# Patient Record
Sex: Female | Born: 1980 | Race: Black or African American | Hispanic: No | Marital: Single | Smoking: Never smoker
Health system: Southern US, Community
[De-identification: ages and names within clinical notes are randomized; demographics above are authoritative.]

## PROBLEM LIST (undated history)

## (undated) DIAGNOSIS — I10 Essential (primary) hypertension: Secondary | ICD-10-CM

## (undated) HISTORY — PX: TONSILLECTOMY AND ADENOIDECTOMY: SUR1326

---

## 2008-11-14 HISTORY — PX: UMBILICAL HERNIA REPAIR: SHX196

## 2018-12-18 ENCOUNTER — Other Ambulatory Visit: Payer: Self-pay

## 2018-12-18 DIAGNOSIS — K859 Acute pancreatitis without necrosis or infection, unspecified: Secondary | ICD-10-CM | POA: Diagnosis present

## 2018-12-18 DIAGNOSIS — R1011 Right upper quadrant pain: Secondary | ICD-10-CM | POA: Diagnosis not present

## 2018-12-18 DIAGNOSIS — K8062 Calculus of gallbladder and bile duct with acute cholecystitis without obstruction: Secondary | ICD-10-CM | POA: Diagnosis not present

## 2018-12-18 DIAGNOSIS — Z79899 Other long term (current) drug therapy: Secondary | ICD-10-CM

## 2018-12-18 DIAGNOSIS — I10 Essential (primary) hypertension: Secondary | ICD-10-CM | POA: Diagnosis present

## 2018-12-18 DIAGNOSIS — E876 Hypokalemia: Secondary | ICD-10-CM | POA: Diagnosis present

## 2018-12-18 DIAGNOSIS — Z1159 Encounter for screening for other viral diseases: Secondary | ICD-10-CM

## 2018-12-19 ENCOUNTER — Inpatient Hospital Stay
Admission: EM | Admit: 2018-12-19 | Discharge: 2018-12-22 | DRG: 444 | Disposition: A | Discharge status: Home / Self Care | Payer: Managed Care, Other (non HMO) | Attending: Family Medicine | Admitting: Family Medicine

## 2018-12-19 ENCOUNTER — Inpatient Hospital Stay: Payer: Managed Care, Other (non HMO)

## 2018-12-19 ENCOUNTER — Other Ambulatory Visit: Payer: Self-pay

## 2018-12-19 ENCOUNTER — Inpatient Hospital Stay: Payer: Managed Care, Other (non HMO) | Admitting: Anesthesiology

## 2018-12-19 ENCOUNTER — Emergency Department: Payer: Managed Care, Other (non HMO)

## 2018-12-19 ENCOUNTER — Encounter
Admission: EM | Disposition: A | Discharge status: Home / Self Care | Payer: Self-pay | Source: Home / Self Care | Attending: Specialist

## 2018-12-19 ENCOUNTER — Encounter: Payer: Self-pay | Admitting: *Deleted

## 2018-12-19 DIAGNOSIS — R1011 Right upper quadrant pain: Secondary | ICD-10-CM | POA: Diagnosis present

## 2018-12-19 DIAGNOSIS — K859 Acute pancreatitis without necrosis or infection, unspecified: Secondary | ICD-10-CM | POA: Diagnosis present

## 2018-12-19 DIAGNOSIS — I10 Essential (primary) hypertension: Secondary | ICD-10-CM | POA: Diagnosis present

## 2018-12-19 DIAGNOSIS — K8042 Calculus of bile duct with acute cholecystitis without obstruction: Secondary | ICD-10-CM

## 2018-12-19 DIAGNOSIS — K8050 Calculus of bile duct without cholangitis or cholecystitis without obstruction: Secondary | ICD-10-CM

## 2018-12-19 DIAGNOSIS — K8062 Calculus of gallbladder and bile duct with acute cholecystitis without obstruction: Secondary | ICD-10-CM | POA: Diagnosis present

## 2018-12-19 DIAGNOSIS — K819 Cholecystitis, unspecified: Secondary | ICD-10-CM

## 2018-12-19 DIAGNOSIS — R945 Abnormal results of liver function studies: Secondary | ICD-10-CM

## 2018-12-19 DIAGNOSIS — Z1159 Encounter for screening for other viral diseases: Secondary | ICD-10-CM | POA: Diagnosis not present

## 2018-12-19 DIAGNOSIS — K810 Acute cholecystitis: Secondary | ICD-10-CM

## 2018-12-19 DIAGNOSIS — R17 Unspecified jaundice: Secondary | ICD-10-CM

## 2018-12-19 DIAGNOSIS — Z79899 Other long term (current) drug therapy: Secondary | ICD-10-CM | POA: Diagnosis not present

## 2018-12-19 DIAGNOSIS — E876 Hypokalemia: Secondary | ICD-10-CM | POA: Diagnosis present

## 2018-12-19 DIAGNOSIS — R7989 Other specified abnormal findings of blood chemistry: Secondary | ICD-10-CM

## 2018-12-19 HISTORY — PX: ERCP: SHX5425

## 2018-12-19 HISTORY — DX: Essential (primary) hypertension: I10

## 2018-12-19 LAB — CBC
HCT: 39.7 % (ref 36.0–46.0)
Hemoglobin: 12.6 g/dL (ref 12.0–15.0)
MCH: 28.1 pg (ref 26.0–34.0)
MCHC: 31.7 g/dL (ref 30.0–36.0)
MCV: 88.4 fL (ref 80.0–100.0)
Platelets: 220 10*3/uL (ref 150–400)
RBC: 4.49 MIL/uL (ref 3.87–5.11)
RDW: 13.6 % (ref 11.5–15.5)
WBC: 6.9 10*3/uL (ref 4.0–10.5)
nRBC: 0 % (ref 0.0–0.2)

## 2018-12-19 LAB — HEPATIC FUNCTION PANEL
ALT: 184 U/L — ABNORMAL HIGH (ref 0–44)
AST: 184 U/L — ABNORMAL HIGH (ref 15–41)
Albumin: 4 g/dL (ref 3.5–5.0)
Alkaline Phosphatase: 123 U/L (ref 38–126)
Bilirubin, Direct: 1.5 mg/dL — ABNORMAL HIGH (ref 0.0–0.2)
Indirect Bilirubin: 1.9 mg/dL — ABNORMAL HIGH (ref 0.3–0.9)
Total Bilirubin: 3.4 mg/dL — ABNORMAL HIGH (ref 0.3–1.2)
Total Protein: 7.6 g/dL (ref 6.5–8.1)

## 2018-12-19 LAB — POCT PREGNANCY, URINE: Preg Test, Ur: NEGATIVE

## 2018-12-19 LAB — BASIC METABOLIC PANEL
Anion gap: 9 (ref 5–15)
BUN: 7 mg/dL (ref 6–20)
CO2: 27 mmol/L (ref 22–32)
Calcium: 9.3 mg/dL (ref 8.9–10.3)
Chloride: 107 mmol/L (ref 98–111)
Creatinine, Ser: 0.77 mg/dL (ref 0.44–1.00)
GFR calc Af Amer: >60 mL/min (ref >60)
GFR calc non Af Amer: >60 mL/min (ref >60)
Glucose, Bld: 105 mg/dL — ABNORMAL HIGH (ref 70–99)
Potassium: 3.6 mmol/L (ref 3.5–5.1)
Sodium: 143 mmol/L (ref 135–145)

## 2018-12-19 LAB — HEMOGLOBIN A1C
Hgb A1c MFr Bld: 5.2 % (ref 4.8–5.6)
Mean Plasma Glucose: 102.54 mg/dL

## 2018-12-19 LAB — LIPASE, BLOOD: Lipase: 41 U/L (ref 11–51)

## 2018-12-19 LAB — TROPONIN I: Troponin I: <0.03 ng/mL (ref <0.03)

## 2018-12-19 LAB — SARS CORONAVIRUS 2 BY RT PCR (HOSPITAL ORDER, PERFORMED IN ~~LOC~~ HOSPITAL LAB): SARS Coronavirus 2: NEGATIVE

## 2018-12-19 LAB — TSH: TSH: 1.4 u[IU]/mL (ref 0.350–4.500)

## 2018-12-19 SURGERY — ERCP, WITH INTERVENTION IF INDICATED
Anesthesia: General

## 2018-12-19 SURGERY — ENDOSCOPIC RETROGRADE CHOLANGIOPANCREATOGRAPHY (ERCP) WITH PROPOFOL
Anesthesia: General

## 2018-12-19 MED ORDER — LIDOCAINE HCL (CARDIAC) PF 100 MG/5ML IV SOSY
PREFILLED_SYRINGE | INTRAVENOUS | Status: DC | PRN
  Administered 2018-12-19: 100 mg via INTRAVENOUS

## 2018-12-19 MED ORDER — GLYCOPYRROLATE 0.2 MG/ML IJ SOLN
INTRAMUSCULAR | Status: AC
  Filled 2018-12-19: qty 1

## 2018-12-19 MED ORDER — ONDANSETRON HCL 4 MG PO TABS: 4.0000 mg | ORAL_TABLET | Freq: Four times a day (QID) | ORAL | Status: DC | PRN

## 2018-12-19 MED ORDER — MORPHINE SULFATE (PF) 2 MG/ML IV SOLN
2.0000 mg | INTRAVENOUS | Status: DC | PRN
  Administered 2018-12-19 (×2): 2 mg via INTRAVENOUS
  Filled 2018-12-19 (×2): qty 1

## 2018-12-19 MED ORDER — LABETALOL HCL 5 MG/ML IV SOLN
5.0000 mg | INTRAVENOUS | Status: DC | PRN
  Filled 2018-12-19: qty 4

## 2018-12-19 MED ORDER — ACETAMINOPHEN 650 MG RE SUPP: 650.0000 mg | Freq: Four times a day (QID) | RECTAL | Status: DC | PRN

## 2018-12-19 MED ORDER — SODIUM CHLORIDE 0.9 % IV BOLUS
1000.0000 mL | Freq: Once | INTRAVENOUS | Status: AC
  Administered 2018-12-19: 05:00:00 1000 mL via INTRAVENOUS

## 2018-12-19 MED ORDER — PROPOFOL 10 MG/ML IV BOLUS
INTRAVENOUS | Status: AC
  Filled 2018-12-19: qty 20

## 2018-12-19 MED ORDER — MORPHINE SULFATE (PF) 4 MG/ML IV SOLN
4.0000 mg | Freq: Once | INTRAVENOUS | Status: AC
  Administered 2018-12-19: 05:00:00 4 mg via INTRAVENOUS
  Filled 2018-12-19: qty 1

## 2018-12-19 MED ORDER — ONDANSETRON HCL 4 MG/2ML IJ SOLN
INTRAMUSCULAR | Status: AC
  Administered 2018-12-19: 4 mg via INTRAVENOUS
  Filled 2018-12-19: qty 2

## 2018-12-19 MED ORDER — ONDANSETRON HCL 4 MG/2ML IJ SOLN
4.0000 mg | INTRAMUSCULAR | Status: AC
  Administered 2018-12-19: 4 mg via INTRAVENOUS
  Filled 2018-12-19: qty 2

## 2018-12-19 MED ORDER — SODIUM CHLORIDE 0.9 % IV SOLN
INTRAVENOUS | Status: DC
  Administered 2018-12-19 – 2018-12-22 (×10): via INTRAVENOUS

## 2018-12-19 MED ORDER — HYDROCHLOROTHIAZIDE 25 MG PO TABS
25.0000 mg | ORAL_TABLET | Freq: Every day | ORAL | Status: DC
  Administered 2018-12-19 – 2018-12-22 (×4): 25 mg via ORAL
  Filled 2018-12-19 (×4): qty 1

## 2018-12-19 MED ORDER — PROPOFOL 500 MG/50ML IV EMUL
INTRAVENOUS | Status: AC
  Filled 2018-12-19: qty 50

## 2018-12-19 MED ORDER — ACETAMINOPHEN 325 MG PO TABS
650.0000 mg | ORAL_TABLET | Freq: Four times a day (QID) | ORAL | Status: DC | PRN
  Administered 2018-12-20: 650 mg via ORAL
  Filled 2018-12-19 (×2): qty 2

## 2018-12-19 MED ORDER — DOCUSATE SODIUM 100 MG PO CAPS
100.0000 mg | ORAL_CAPSULE | Freq: Two times a day (BID) | ORAL | Status: DC
  Administered 2018-12-19 – 2018-12-22 (×5): 100 mg via ORAL
  Filled 2018-12-19 (×6): qty 1

## 2018-12-19 MED ORDER — HEPARIN SODIUM (PORCINE) 5000 UNIT/ML IJ SOLN
5000.0000 [IU] | Freq: Three times a day (TID) | INTRAMUSCULAR | Status: DC
  Administered 2018-12-19: 5000 [IU] via SUBCUTANEOUS
  Filled 2018-12-19: qty 1

## 2018-12-19 MED ORDER — ONDANSETRON HCL 4 MG/2ML IJ SOLN
4.0000 mg | Freq: Four times a day (QID) | INTRAMUSCULAR | Status: DC | PRN
  Administered 2018-12-19: 4 mg via INTRAVENOUS

## 2018-12-19 MED ORDER — SODIUM CHLORIDE 0.9% FLUSH: 3.0000 mL | Freq: Once | INTRAVENOUS | Status: DC

## 2018-12-19 MED ORDER — INDOMETHACIN 50 MG RE SUPP
100.0000 mg | Freq: Once | RECTAL | Status: DC
  Filled 2018-12-19: qty 2

## 2018-12-19 MED ORDER — INDOMETHACIN 50 MG RE SUPP
RECTAL | Status: AC
  Administered 2018-12-19: 100 mg
  Filled 2018-12-19: qty 2

## 2018-12-19 MED ORDER — SODIUM CHLORIDE 0.9 % IV SOLN: INTRAVENOUS | Status: DC

## 2018-12-19 MED ORDER — PROPOFOL 10 MG/ML IV BOLUS
INTRAVENOUS | Status: DC | PRN
  Administered 2018-12-19: 50 mg via INTRAVENOUS
  Administered 2018-12-19: 90 mg via INTRAVENOUS
  Administered 2018-12-19: 30 mg via INTRAVENOUS

## 2018-12-19 MED ORDER — PROPOFOL 500 MG/50ML IV EMUL
INTRAVENOUS | Status: DC | PRN
  Administered 2018-12-19: 150 ug/kg/min via INTRAVENOUS

## 2018-12-19 MED ORDER — LACTATED RINGERS IV SOLN: INTRAVENOUS | Status: DC

## 2018-12-19 MED ORDER — ACETAMINOPHEN 325 MG PO TABS: 650.0000 mg | ORAL_TABLET | Freq: Four times a day (QID) | ORAL | Status: DC | PRN

## 2018-12-19 MED ORDER — PIPERACILLIN-TAZOBACTAM 3.375 G IVPB 30 MIN
3.3750 g | Freq: Once | INTRAVENOUS | Status: AC
  Administered 2018-12-19: 05:00:00 3.375 g via INTRAVENOUS
  Filled 2018-12-19: qty 50

## 2018-12-19 MED ORDER — LIDOCAINE HCL (PF) 2 % IJ SOLN
INTRAMUSCULAR | Status: AC
  Filled 2018-12-19: qty 10

## 2018-12-19 NOTE — Transfer of Care (Signed)
Immediate Anesthesia Transfer of Care Note  Patient: Tina Cummings  Procedure(s) Performed: ENDOSCOPIC RETROGRADE CHOLANGIOPANCREATOGRAPHY (ERCP) (N/A )  Patient Location: Endoscopy Unit  Anesthesia Type:General  Level of Consciousness: sedated  Airway & Oxygen Therapy: Patient Spontanous Breathing and Patient connected to nasal cannula oxygen  Post-op Assessment: Report given to RN and Post -op Vital signs reviewed and stable  Post vital signs: Reviewed and stable  Last Vitals:  Vitals Value Taken Time  BP 134/93 12/19/2018 11:21 AM  Temp 36.7 C 12/19/2018 11:21 AM  Pulse 108 12/19/2018 11:21 AM  Resp 14 12/19/2018 11:21 AM  SpO2 93 % 12/19/2018 11:21 AM  Vitals shown include unvalidated device data.  Last Pain:  Vitals:   12/19/18 1121  TempSrc: Tympanic  PainSc:          Complications: No apparent anesthesia complications

## 2018-12-19 NOTE — ED Notes (Signed)
Dr. Diamond in to see pt.  

## 2018-12-19 NOTE — Anesthesia Post-op Follow-up Note (Signed)
Anesthesia QCDR form completed.        

## 2018-12-19 NOTE — Anesthesia Postprocedure Evaluation (Signed)
Anesthesia Post Note  Patient: Arieona Murchie  Procedure(s) Performed: ENDOSCOPIC RETROGRADE CHOLANGIOPANCREATOGRAPHY (ERCP) (N/A )  Patient location during evaluation: Endoscopy Anesthesia Type: General Level of consciousness: awake and alert Pain management: pain level controlled Vital Signs Assessment: post-procedure vital signs reviewed and stable Respiratory status: spontaneous breathing, nonlabored ventilation, respiratory function stable and patient connected to nasal cannula oxygen Cardiovascular status: blood pressure returned to baseline and stable Postop Assessment: no apparent nausea or vomiting Anesthetic complications: no     Last Vitals:  Vitals:   12/19/18 1131 12/19/18 1151  BP: (!) 139/103 (!) 140/99  Pulse: 92   Resp: 14 15  Temp:    SpO2: 100% 100%    Last Pain:  Vitals:   12/19/18 1131  TempSrc:   PainSc: 0-No pain                 Lenard Simmer

## 2018-12-19 NOTE — Consult Note (Signed)
[facility] GI Inpatient Consult Note   Jamey Reaseodoro Keith , M.D.  Reason for Consult: Choledocholithiasis, elevated liver enzymes, abdominal pain, cholecystitis   Attending Requesting Consult: Hilda LiasVivek Sainani, MD   History of Present Illness: Tina Cummings is a 38 y.o. female presenting to the ER after few days of progressive abdominal pain, nausea.  Patient is currently pain-free after institution of IV antibiotics.  The patient claims the pain is in the mid to right upper quadrant radiates to the back.  There is been no hematemesis hematochezia change in bowel habits or fever.  Preprocedural coronavirus testing is negative.  Past Medical History:  Past Medical History:  Diagnosis Date  . HTN (hypertension)     Problem List: Patient Active Problem List   Diagnosis Date Noted  . Cholecystitis 12/19/2018    Past Surgical History: Past Surgical History:  Procedure Laterality Date  . TONSILLECTOMY AND ADENOIDECTOMY    . UMBILICAL HERNIA REPAIR      Allergies: No Known Allergies  Home Medications: Medications Prior to Admission  Medication Sig Dispense Refill Last Dose  . hydrochlorothiazide (HYDRODIURIL) 25 MG tablet Take 25 mg by mouth daily.   Past Month at Unknown time  . medroxyPROGESTERone (DEPO-PROVERA) 150 MG/ML injection Inject 150 mg into the muscle every 3 (three) months.   Past Month at Unknown time   Home medication reconciliation was completed with the patient.   Scheduled Inpatient Medications:   . [MAR Hold] docusate sodium  100 mg Oral BID  . [MAR Hold] heparin  5,000 Units Subcutaneous Q8H  . [MAR Hold] hydrochlorothiazide  25 mg Oral Daily  . [MAR Hold] indomethacin  100 mg Rectal Once  . [MAR Hold] sodium chloride flush  3 mL Intravenous Once    Continuous Inpatient Infusions:   . sodium chloride 150 mL/hr at 12/19/18 0933  . lactated ringers      PRN Inpatient Medications:  [MAR Hold] acetaminophen **OR** [MAR Hold] acetaminophen, [MAR Hold]  labetalol, [MAR Hold]  morphine injection, [MAR Hold] ondansetron **OR** [MAR Hold] ondansetron (ZOFRAN) IV  Family History: family history includes Diabetes Mellitus II in her brother.   GI Family History: Negative  Social History:   reports that she has never smoked. She has never used smokeless tobacco. She reports previous alcohol use. She reports previous drug use. The patient denies ETOH, tobacco, or drug use.    Review of Systems: Review of Systems - Negative except That in the history of present illness  Physical Examination: BP 138/78 (BP Location: Right Arm)   Pulse 64   Temp 98.5 F (36.9 C) (Tympanic)   Resp 16   Ht 5\' 8"  (1.727 m)   Wt 81 kg   SpO2 98%   BMI 27.15 kg/m  Physical Exam Constitutional:      General: She is not in acute distress.    Appearance: She is well-developed. She is not ill-appearing.  HENT:     Head: Normocephalic and atraumatic.  Eyes:     Extraocular Movements: Extraocular movements intact.  Cardiovascular:     Rate and Rhythm: Normal rate.     Heart sounds: Normal heart sounds.  Pulmonary:     Effort: Pulmonary effort is normal.     Breath sounds: Normal breath sounds.  Abdominal:     General: Abdomen is flat. Bowel sounds are normal. There is no distension. There are no signs of injury.     Palpations: Abdomen is soft.     Tenderness: There is no abdominal tenderness.  Hernia: No hernia is present.  Skin:    General: Skin is warm and dry.  Neurological:     General: No focal deficit present.     Mental Status: She is alert.  Psychiatric:        Mood and Affect: Mood normal.        Behavior: Behavior normal.     Data: Lab Results  Component Value Date   WBC 6.9 12/19/2018   HGB 12.6 12/19/2018   HCT 39.7 12/19/2018   MCV 88.4 12/19/2018   PLT 220 12/19/2018   Recent Labs  Lab 12/19/18 0018  HGB 12.6   Lab Results  Component Value Date   NA 143 12/19/2018   K 3.6 12/19/2018   CL 107 12/19/2018   CO2 27  12/19/2018   BUN 7 12/19/2018   CREATININE 0.77 12/19/2018   Lab Results  Component Value Date   ALT 184 (H) 12/19/2018   AST 184 (H) 12/19/2018   ALKPHOS 123 12/19/2018   BILITOT 3.4 (H) 12/19/2018   No results for input(s): APTT, INR, PTT in the last 168 hours. CBC Latest Ref Rng & Units 12/19/2018  WBC 4.0 - 10.5 K/uL 6.9  Hemoglobin 12.0 - 15.0 g/dL 14.3  Hematocrit 88.8 - 46.0 % 39.7  Platelets 150 - 400 K/uL 220    STUDIES: Dg Chest 2 View  Result Date: 12/19/2018 CLINICAL DATA:  Upper abdominal pain EXAM: CHEST - 2 VIEW COMPARISON:  None. FINDINGS: Heart and mediastinal contours are within normal limits. No focal opacities or effusions. No acute bony abnormality. IMPRESSION: No active cardiopulmonary disease. Electronically Signed   By: Charlett Nose M.D.   On: 12/19/2018 02:49   US Abdomen Limited Ruq  Result Date: 12/19/2018 CLINICAL DATA:  Upper abdominal pain and tenderness for several days EXAM: ULTRASOUND ABDOMEN LIMITED RIGHT UPPER QUADRANT COMPARISON:  None. FINDINGS: Gallbladder: Gallbladder is distended and contains layering calculi. A stone is seen at the level of the gallbladder neck that is fixed. Gallbladder wall is thickened to 6 mm and striated by edema. Common bile duct: Diameter: 8 mm. A stone is seen in the distal common bile duct measuring 6 mm. Liver: No focal lesion identified. Within normal limits in parenchymal echogenicity. Portal vein is patent on color Doppler imaging with normal direction of blood flow towards the liver. IMPRESSION: 1. Cholelithiasis. Except for Eulah Pont sign there are findings of acute cholecystitis. 2. Choledocholithiasis with dilated bile ducts. Electronically Signed   By: Marnee Spring M.D.   On: 12/19/2018 04:35   @IMAGES @  Assessment: 1. Choledocholithiasis - 101mm calculus in the distal CBD.  2. Cholecystitis. Presumptive. IV antibiotics started. Surgical consult ordered. 3. Elevated liver enzymes. secondaryto  #1.  Recommendations: 1. Continue IVF, antibiotics. 2. ERCP. The patient understands the nature of the planned procedure, indications, risks, alternatives and potential complications including but not limited to bleeding, infection, perforation, pancreatitis, damage to internal organs and possible oversedation/side effects from anesthesia. The patient agrees and gives consent to proceed.   Case discussed with Dr. Servando Snare who has agreed to do the ERCP.  Please refer to procedure notes for findings, recommendations and patient disposition/instructions.  Thank you for the consult. Please call with questions or concerns.  Rosina Lowenstein, "Mellody Dance MD [facility] [address] [place], [place] [address] [phone]  12/19/2018 10:21 AM

## 2018-12-19 NOTE — ED Notes (Signed)
ED TO INPATIENT HANDOFF REPORT  ED Nurse Name and Phone #: Misty Stanley [phone]   S Name/Age/Gender Tina Cummings 38 y.o. female Room/Bed: ED02A/ED02A  Code Status   Code Status: Not on file  Home/SNF/Other Home Patient oriented to: self, place, time and situation Is this baseline? Yes   Triage Complete: Triage complete  Chief Complaint Chest Pain; Abdominal Pain   Triage Note Pt reports pain beneath both breast in upper abdomen.  Sx began this am.  No n/v/d.  No sob.  Pt reports increased pain on the left side of abdomen.  pt alert  Speech clear.      Allergies No Known Allergies  Level of Care/Admitting Diagnosis ED Disposition    ED Disposition Condition Comment   Admit  Hospital Area: [facility] [[id number]]  Level of Care: Med-Surg [16]  Covid Evaluation: Screening Protocol (No Symptoms)  Diagnosis: Cholecystitis [161096]  Admitting Physician: Arnaldo Natal [[id number]]  Attending Physician: Arnaldo Natal [[id number]]  Estimated length of stay: past midnight tomorrow  Certification:: I certify this patient will need inpatient services for at least 2 midnights  PT Class (Do Not Modify): Inpatient [101]  PT Acc Code (Do Not Modify): Private [1]       B Medical/Surgery History History reviewed. No pertinent past medical history. History reviewed. No pertinent surgical history.   A IV Location/Drains/Wounds Patient Lines/Drains/Airways Status   Active Line/Drains/Airways    Name:   Placement date:   Placement time:   Site:   Days:   Peripheral IV 12/19/18 Left Antecubital   12/19/18    0511    Antecubital   less than 1          Intake/Output Last 24 hours No intake or output data in the 24 hours ending 12/19/18 0552  Labs/Imaging Results for orders placed or performed during the hospital encounter of 12/19/18 (from the past 48 hour(s))  Basic metabolic panel     Status: Abnormal   Collection Time: 12/19/18 12:18 AM  Result Value Ref  Range   Sodium 143 135 - 145 mmol/L   Potassium 3.6 3.5 - 5.1 mmol/L   Chloride 107 98 - 111 mmol/L   CO2 27 22 - 32 mmol/L   Glucose, Bld 105 (H) 70 - 99 mg/dL   BUN 7 6 - 20 mg/dL   Creatinine, Ser 2.95 0.44 - 1.00 mg/dL   Calcium 9.3 8.9 - 62.1 mg/dL   GFR calc non Af Amer >60 >60 mL/min   GFR calc Af Amer >60 >60 mL/min   Anion gap 9 5 - 15    Comment: Performed at [facility], [address] [place], [place] [address]  CBC     Status: None   Collection Time: 12/19/18 12:18 AM  Result Value Ref Range   WBC 6.9 4.0 - 10.5 K/uL   RBC 4.49 3.87 - 5.11 MIL/uL   Hemoglobin 12.6 12.0 - 15.0 g/dL   HCT 78.4 69.6 - 29.5 %   MCV 88.4 80.0 - 100.0 fL   MCH 28.1 26.0 - 34.0 pg   MCHC 31.7 30.0 - 36.0 g/dL   RDW 28.4 13.2 - 44.0 %   Platelets 220 150 - 400 K/uL   nRBC 0.0 0.0 - 0.2 %    Comment: Performed at [facility], [address] [place], [place] [address]  Troponin I - ONCE - STAT     Status: None   Collection Time: 12/19/18 12:18 AM  Result Value  Ref Range   Troponin I <0.03 <0.03 ng/mL    Comment: Performed at [facility], [address] [place], [place] [address]  Hepatic function panel     Status: Abnormal   Collection Time: 12/19/18 12:18 AM  Result Value Ref Range   Total Protein 7.6 6.5 - 8.1 g/dL   Albumin 4.0 3.5 - 5.0 g/dL   AST 177 (H) 15 - 41 U/L   ALT 184 (H) 0 - 44 U/L   Alkaline Phosphatase 123 38 - 126 U/L   Total Bilirubin 3.4 (H) 0.3 - 1.2 mg/dL   Bilirubin, Direct 1.5 (H) 0.0 - 0.2 mg/dL   Indirect Bilirubin 1.9 (H) 0.3 - 0.9 mg/dL    Comment: Performed at [facility], [address] [place], [place] [address]  Lipase, blood     Status: None   Collection Time: 12/19/18 12:18 AM  Result Value Ref Range   Lipase 41 11 - 51 U/L    Comment: Performed at [facility], [address] [place], [place] [address]  Pregnancy, urine POC     Status: None   Collection Time: 12/19/18 12:27 AM  Result  Value Ref Range   Preg Test, Ur NEGATIVE NEGATIVE    Comment:        THE SENSITIVITY OF THIS METHODOLOGY IS >24 mIU/mL    Dg Chest 2 View  Result Date: 12/19/2018 CLINICAL DATA:  Upper abdominal pain EXAM: CHEST - 2 VIEW COMPARISON:  None. FINDINGS: Heart and mediastinal contours are within normal limits. No focal opacities or effusions. No acute bony abnormality. IMPRESSION: No active cardiopulmonary disease. Electronically Signed   By: Charlett Nose M.D.   On: 12/19/2018 02:49   US Abdomen Limited Ruq  Result Date: 12/19/2018 CLINICAL DATA:  Upper abdominal pain and tenderness for several days EXAM: ULTRASOUND ABDOMEN LIMITED RIGHT UPPER QUADRANT COMPARISON:  None. FINDINGS: Gallbladder: Gallbladder is distended and contains layering calculi. A stone is seen at the level of the gallbladder neck that is fixed. Gallbladder wall is thickened to 6 mm and striated by edema. Common bile duct: Diameter: 8 mm. A stone is seen in the distal common bile duct measuring 6 mm. Liver: No focal lesion identified. Within normal limits in parenchymal echogenicity. Portal vein is patent on color Doppler imaging with normal direction of blood flow towards the liver. IMPRESSION: 1. Cholelithiasis. Except for Eulah Pont sign there are findings of acute cholecystitis. 2. Choledocholithiasis with dilated bile ducts. Electronically Signed   By: Marnee Spring M.D.   On: 12/19/2018 04:35    Pending Labs Unresulted Labs (From admission, onward)    Start     Ordered   12/19/18 0459  SARS Coronavirus 2 (CEPHEID - Performed in [facility] hospital lab), Hosp Order  (Asymptomatic Patients Labs)  Once,   STAT    Question:  Rule Out  Answer:  Yes   12/19/18 0458   Signed and Held  TSH  Add-on,   R     Signed and Held   Signed and Held  Hemoglobin A1c  Add-on,   R     Signed and Held          Vitals/Pain Today's Vitals   12/19/18 0007 12/19/18 0010 12/19/18 0223 12/19/18 0419  BP:  (!) 137/102 (!) 162/100 (!) 169/100   Pulse:  77 72 72  Resp:  20 20 18   Temp:  99.3 F (37.4 C)    TempSrc:  Oral    SpO2:  99% 100% 99%  Weight: 81.6 kg     Height: 5\' 8"  (1.727 m)     PainSc: 6   8      Isolation Precautions No active isolations  Medications Medications  sodium chloride flush (NS) 0.9 % injection 3 mL (has no administration in time range)  morphine 4 MG/ML injection 4 mg (4 mg Intravenous Given 12/19/18 0515)  ondansetron (ZOFRAN) injection 4 mg (4 mg Intravenous Given 12/19/18 0515)  sodium chloride 0.9 % bolus 1,000 mL (1,000 mLs Intravenous New Bag/Given 12/19/18 0514)  piperacillin-tazobactam (ZOSYN) IVPB 3.375 g (3.375 g Intravenous New Bag/Given 12/19/18 0515)    Mobility walks Low fall risk   Focused Assessments    R Recommendations: See Admitting Provider Note  Report given to:   Additional Notes: none

## 2018-12-19 NOTE — ED Notes (Signed)
Attempted to call report to call, nurse to call back

## 2018-12-19 NOTE — Progress Notes (Signed)
C/o nausea. Diastolic 103, denies pain. Dr. Karlton Lemon notified. Zofran 4mg  adm. IV

## 2018-12-19 NOTE — Progress Notes (Signed)
[employer] [facility] [place] [facility]     PATIENT NAME: Tina Cummings    MR#:  [id number]  DATE OF BIRTH:  Mar 30, 1981  SUBJECTIVE:   She presented to the hospital secondary to abdominal pain and noted to have choledocholithiasis.  Status post ERCP with sphincterotomy and extraction of 2 stones today.  REVIEW OF SYSTEMS:    Review of Systems  Constitutional: Negative for chills and fever.  HENT: Negative for congestion and tinnitus.   Eyes: Negative for blurred vision and double vision.  Respiratory: Negative for cough, shortness of breath and wheezing.   Cardiovascular: Negative for chest pain, orthopnea and PND.  Gastrointestinal: Positive for abdominal pain. Negative for diarrhea, nausea and vomiting.  Genitourinary: Negative for dysuria and hematuria.  Neurological: Negative for dizziness, sensory change and focal weakness.  All other systems reviewed and are negative.   Nutrition: NPO Tolerating Diet: Yes Tolerating PT: Ambulatory     DRUG ALLERGIES:  No Known Allergies  VITALS:  Blood pressure (!) 140/99, pulse 92, temperature 98 F (36.7 C), temperature source Tympanic, resp. rate 15, height 5\' 8"  (1.727 m), weight 81 kg, SpO2 100 %.  PHYSICAL EXAMINATION:   Physical Exam  GENERAL:  38 y.o.-year-old patient lying in bed in no acute distress.  EYES: Pupils equal, round, reactive to light and accommodation. No scleral icterus. Extraocular muscles intact.  HEENT: Head atraumatic, normocephalic. Oropharynx and nasopharynx clear.  NECK:  Supple, no jugular venous distention. No thyroid enlargement, no tenderness.  LUNGS: Normal breath sounds bilaterally, no wheezing, rales, rhonchi. No use of accessory muscles of respiration.  CARDIOVASCULAR: S1, S2 normal. No murmurs, rubs, or gallops.  ABDOMEN: Soft, tender in RUQ area, nondistended. Bowel sounds present. No organomegaly or mass.  EXTREMITIES: No cyanosis, clubbing or edema b/l.    NEUROLOGIC:  Cranial nerves II through XII are intact. No focal Motor or sensory deficits b/l.   PSYCHIATRIC: The patient is alert and oriented x 3.  SKIN: No obvious rash, lesion, or ulcer.    LABORATORY PANEL:   CBC Recent Labs  Lab 12/19/18 0018  WBC 6.9  HGB 12.6  HCT 39.7  PLT 220   ------------------------------------------------------------------------------------------------------------------  Chemistries  Recent Labs  Lab 12/19/18 0018  NA 143  K 3.6  CL 107  CO2 27  GLUCOSE 105*  BUN 7  CREATININE 0.77  CALCIUM 9.3  AST 184*  ALT 184*  ALKPHOS 123  BILITOT 3.4*   ------------------------------------------------------------------------------------------------------------------  Cardiac Enzymes Recent Labs  Lab 12/19/18 0018  TROPONINI <0.03   ------------------------------------------------------------------------------------------------------------------  RADIOLOGY:  Dg Chest 2 View  Result Date: 12/19/2018 CLINICAL DATA:  Upper abdominal pain EXAM: CHEST - 2 VIEW COMPARISON:  None. FINDINGS: Heart and mediastinal contours are within normal limits. No focal opacities or effusions. No acute bony abnormality. IMPRESSION: No active cardiopulmonary disease. Electronically Signed   By: Charlett NoseKevin  Dover M.D.   On: 12/19/2018 02:49   Dg C-arm 1-60 Min-no Report  Result Date: 12/19/2018 Fluoroscopy was utilized by the requesting physician.  No radiographic interpretation.   [place] Abdomen Limited Ruq  Result Date: 12/19/2018 CLINICAL DATA:  Upper abdominal pain and tenderness for several days EXAM: ULTRASOUND ABDOMEN LIMITED RIGHT UPPER QUADRANT COMPARISON:  None. FINDINGS: Gallbladder: Gallbladder is distended and contains layering calculi. A stone is seen at the level of the gallbladder neck that is fixed. Gallbladder wall is thickened to 6 mm and striated by edema. Common bile duct: Diameter: 8 mm. A stone is seen in the distal  common bile duct measuring 6 mm. Liver: No focal  lesion identified. Within normal limits in parenchymal echogenicity. Portal vein is patent on color Doppler imaging with normal direction of blood flow towards the liver. IMPRESSION: 1. Cholelithiasis. Except for Eulah Pont sign there are findings of acute cholecystitis. 2. Choledocholithiasis with dilated bile ducts. Electronically Signed   By: Marnee Spring M.D.   On: 12/19/2018 04:35     ASSESSMENT AND PLAN:   38 year old female with past medical history of hypertension who presented to the hospital due to abdominal pain and noted to have choledocholithiasis with findings suggestive of acute cholecystitis.  1.  Choledocholithiasis/acute cholecystitis-this is a source of patient's abdominal pain admission. - Seen by gastroenterology and status post ERCP today with sphincterotomy and extraction of 2 stones. -Follow LFTs, follow clinically.  Continue supportive care with IV fluids, pain control  2.  Abnormal LFTs- secondary to as mentioned above. -Follow LFTs post ERCP today.  Watch for post ERCP pancreatitis.  3.  Essential hypertension-continue hydrochlorothiazide.     All the records are reviewed and case discussed with Care Management/Social Worker. Management plans discussed with the patient, family and they are in agreement.  CODE STATUS: Full code  DVT Prophylaxis: Hep SQ  TOTAL TIME TAKING CARE OF THIS PATIENT: 30 minutes.   POSSIBLE D/C IN 1-2 DAYS, DEPENDING ON CLINICAL CONDITION.   Houston Siren M.D on 12/19/2018 at 12:34 PM  Between 7am to 6pm - Pager - [phone]  After 6pm go to [email or web address] - Social research officer, government  [facility] [place] [facility]  Office  [phone]  CC: Primary care physician; Jenell Milliner, MD

## 2018-12-19 NOTE — ED Triage Notes (Signed)
Pt reports pain beneath both breast in upper abdomen.  Sx began this am.  No n/v/d.  No sob.  Pt reports increased pain on the left side of abdomen.  pt alert  Speech clear.

## 2018-12-19 NOTE — Consult Note (Signed)
SURGICAL CONSULTATION NOTE (initial) - cpt: 16109  HISTORY OF PRESENT ILLNESS (HPI):  38 y.o. female presented to [facility] ED this morning for evaluation of abdominal pain. Patient reports she developed severe RUQ > epigastric abdominal pain after eating fried chicken and other fatty greasy foods yesterday late afternoon/evening. Her pain has since improved significantly since her ERCP today, and she has since been tolerating clear liquids, denies N/V, fever/chills, CP, or SOB. She additionally recalls a history of mild RUQ abdominal pain after fatty foods, but never thought much of it. Of note, she previously underwent open repair of her umbilical hernia, but review of OSH medical records state no mesh was placed.  Surgery is consulted by medical physician Dr. Cherlynn Kaiser in this context for evaluation and management of acute cholecystitis with choledocholithiasis.  PAST MEDICAL HISTORY (PMH):  Past Medical History:  Diagnosis Date  . HTN (hypertension)     PAST SURGICAL HISTORY (PSH):  Past Surgical History:  Procedure Laterality Date  . TONSILLECTOMY AND ADENOIDECTOMY    . UMBILICAL HERNIA REPAIR  11/14/2008   Open primary repair without mesh (Dr. Jacquenette Shone at [facility])    MEDICATIONS:  Prior to Admission medications   Medication Sig Start Date End Date Taking? Authorizing Provider  hydrochlorothiazide (HYDRODIURIL) 25 MG tablet Take 25 mg by mouth daily. 02/13/18  Yes [provider]  medroxyPROGESTERone (DEPO-PROVERA) 150 MG/ML injection Inject 150 mg into the muscle every 3 (three) months. 04/10/15  Yes [provider]    ALLERGIES:  No Known Allergies   SOCIAL HISTORY:  Social History   Socioeconomic History  . Marital status: Legally Separated    Spouse name: Not on file  . Number of children: Not on file  . Years of education: Not on file  . Highest education level: Not on file  Occupational History  . Not on file  Social Needs  . Financial resource strain: Not on  file  . Food insecurity:    Worry: Not on file    Inability: Not on file  . Transportation needs:    Medical: Not on file    Non-medical: Not on file  Tobacco Use  . Smoking status: Never Smoker  . Smokeless tobacco: Never Used  Substance and Sexual Activity  . Alcohol use: Not Currently  . Drug use: Not Currently  . Sexual activity: Not on file  Lifestyle  . Physical activity:    Days per week: Not on file    Minutes per session: Not on file  . Stress: Not on file  Relationships  . Social connections:    Talks on phone: Not on file    Gets together: Not on file    Attends religious service: Not on file    Active member of club or organization: Not on file    Attends meetings of clubs or organizations: Not on file    Relationship status: Not on file  . Intimate partner violence:    Fear of current or ex partner: Not on file    Emotionally abused: Not on file    Physically abused: Not on file    Forced sexual activity: Not on file  Other Topics Concern  . Not on file  Social History Narrative  . Not on file    The patient currently resides (home / rehab facility / nursing home): Home The patient normally is (ambulatory / bedbound): Ambulatory   FAMILY HISTORY:  Family History  Problem Relation Age of Onset  . Diabetes Mellitus II  Brother      REVIEW OF SYSTEMS:  Constitutional: denies weight loss, fever, chills, or sweats  Eyes: denies any other vision changes, history of eye injury  ENT: denies sore throat, hearing problems  Respiratory: denies shortness of breath, wheezing  Cardiovascular: denies chest pain, palpitations  Gastrointestinal: abdominal pain, N/V, and bowel function as per HPI Genitourinary: denies burning with urination or urinary frequency Musculoskeletal: denies any other joint pains or cramps  Skin: denies any other rashes or skin discolorations  Neurological: denies any other headache, dizziness, weakness  Psychiatric: denies any other  depression, anxiety   All other review of systems were negative   VITAL SIGNS:  Temp:  [98.6 F (37 C)-99.3 F (37.4 C)] 98.6 F (37 C) (05/06 0825) Pulse Rate:  [49-86] 49 (05/06 0825) Resp:  [16-20] 16 (05/06 0825) BP: (134-169)/(78-102) 138/78 (05/06 0825) SpO2:  [90 %-100 %] 100 % (05/06 0825) Weight:  [81 kg-81.6 kg] 81 kg (05/06 0825)     Height: 5\' 8"  (172.7 cm) Weight: 81 kg BMI (Calculated): 27.16   INTAKE/OUTPUT:  This shift: Total I/O In: -  Out: 1 [Urine:1]  Last 2 shifts: @IOLAST2SHIFTS @   PHYSICAL EXAM:  Constitutional:  -- Normal body habitus  -- Awake, alert, and oriented x3, no apparent distress Eyes:  -- Pupils equally round and reactive to light  -- No scleral icterus, B/L no occular discharge Ear, nose, throat: -- Neck is FROM WNL -- No jugular venous distension  Pulmonary:  -- No wheezes or rhales -- Equal breath sounds bilaterally -- Breathing non-labored at rest Cardiovascular:  -- S1, S2 present  -- No pericardial rubs  Gastrointestinal:  -- Abdomen soft, nontender, non-distended, no guarding or rebound tenderness -- No abdominal masses appreciated, pulsatile or otherwise, well-healed infra-umbilical post-surgical scar Musculoskeletal and Integumentary:  -- Wounds or skin discoloration: None appreciated except as described above (GI) -- Extremities: B/L UE and LE FROM, hands and feet warm, no edema  Neurologic:  -- Motor function: Intact and symmetric -- Sensation: Intact and symmetric Psychiatric:  -- Mood and affect WNL  Labs:  CBC Latest Ref Rng & Units 12/19/2018  WBC 4.0 - 10.5 K/uL 6.9  Hemoglobin 12.0 - 15.0 g/dL 48.1  Hematocrit 85.6 - 46.0 % 39.7  Platelets 150 - 400 K/uL 220   CMP Latest Ref Rng & Units 12/19/2018  Glucose 70 - 99 mg/dL 314(H)  BUN 6 - 20 mg/dL 7  Creatinine 7.02 - 6.37 mg/dL 8.58  Sodium 850 - 277 mmol/L 143  Potassium 3.5 - 5.1 mmol/L 3.6  Chloride 98 - 111 mmol/L 107  CO2 22 - 32 mmol/L 27  Calcium 8.9  - 10.3 mg/dL 9.3  Total Protein 6.5 - 8.1 g/dL 7.6  Total Bilirubin 0.3 - 1.2 mg/dL 4.1(O)  Alkaline Phos 38 - 126 U/L 123  AST 15 - 41 U/L 184(H)  ALT 0 - 44 U/L 184(H)   Imaging studies:  Limited RUQ Abdominal Ultrasound (12/19/2018) Gallbladder is distended and contains layering calculi. A stone is seen at the level of the gallbladder neck that is fixed. Gallbladder wall is thickened to 6 mm and striated by edema.  Common bile duct diameter measures 8 mm.  A stone seen in the distal common bile duct measures 6 mm.  Assessment/Plan: (ICD-10's: K80.00, K80.50) 38 y.o. overall healthy female with acute calculous cholecystitis and choledocholithiasis, complicated by comorbidities including only HTN.   - ERCP today   - agree with clear liquids diet today, NPO after  midnight  - follow up/trend LFT's, lipase, and CBC tomorrow morning  - all risks, benefits, and alternatives to cholecystectomy were discussed with the patient, all of her questions were answered to her expressed satisfaction, patient expresses she wishes to proceed, and informed consent was obtained.  - will tentatively plan for laparoscopic cholecystectomy prior to discharge (possibly tomorrow if LFT's improving and no post-ERCP pancreatitis), though patient expresses understanding this will be determined and performed by rounding surgeon  - DVT prophylaxis  All of the above findings and recommendations were discussed with the patient and her RN, and all of patient's questions were answered to her expressed satisfaction.  Thank you for the opportunity to participate in this patient's care.   -- Scherrie GerlachJason E. Earlene Plateravis, MD, RPVI [place]: [facility] General Surgery - Partnering for exceptional care. Office: [phone]

## 2018-12-19 NOTE — Op Note (Signed)
[facility] Patient Name: Tina Cummings Procedure Date: 12/19/2018 10:25 AM MRN: [id number] Account #: [id number] Date of Birth: April 07, 1981 Admit Type: Inpatient Age: 38 Room: [facility] ENDO ROOM 4 Gender: Female Note Status: Finalized Procedure:            ERCP Indications:          Common bile duct stone(s) Providers:            Midge Minium MD, MD Referring MD:         Jenell Milliner, MD (Referring MD) Medicines:            Propofol per Anesthesia Complications:        No immediate complications. Procedure:            Pre-Anesthesia Assessment:                       - Prior to the procedure, a History and Physical was                        performed, and patient medications and allergies were                        reviewed. The patient's tolerance of previous                        anesthesia was also reviewed. The risks and benefits of                        the procedure and the sedation options and risks were                        discussed with the patient. All questions were                        answered, and informed consent was obtained. Prior                        Anticoagulants: The patient has taken no previous                        anticoagulant or antiplatelet agents. ASA Grade                        Assessment: II - A patient with mild systemic disease.                        After reviewing the risks and benefits, the patient was                        deemed in satisfactory condition to undergo the                        procedure.                       After obtaining informed consent, the scope was passed                        under direct vision. Throughout the procedure, the  patient's blood pressure, pulse, and oxygen saturations                        were monitored continuously. The Duodenoscope was                        introduced through the mouth, and used to inject   contrast into and used to inject contrast into the bile                        duct. The ERCP was accomplished without difficulty. The                        patient tolerated the procedure well. Findings:      The scout film was normal. The esophagus was successfully intubated       under direct vision. The scope was advanced to a normal major papilla in       the descending duodenum without detailed examination of the pharynx,       larynx and associated structures, and upper GI tract. The upper GI tract       was grossly normal. The bile duct was deeply cannulated with the       short-nosed traction sphincterotome. Contrast was injected. I personally       interpreted the bile duct images. There was brisk flow of contrast       through the ducts. Image quality was excellent. Contrast extended to the       entire biliary tree. The lower third of the main bile duct contained one       stone. A wire was passed into the biliary tree. A 6 mm biliary       sphincterotomy was made with a traction (standard) sphincterotome using       ERBE electrocautery. The sphincterotomy oozed blood. The biliary tree       was swept with a 15 mm balloon starting at the bifurcation. Two stones       were removed. No stones remained. Impression:           - Choledocholithiasis was found. Complete removal was                        accomplished by biliary sphincterotomy and balloon                        extraction.                       - A biliary sphincterotomy was performed.                       - The biliary tree was swept. Recommendation:       - Return patient to hospital ward for ongoing care.                       - Clear liquid diet today.                       - Watch for pancreatitis, bleeding, perforation, and                        cholangitis. Procedure Code(s):    --- Professional ---  [phone], Endoscopic retrograde cholangiopancreatography                        (ERCP);  with removal of calculi/debris from                        biliary/pancreatic duct(s)                       43262, Endoscopic retrograde cholangiopancreatography                        (ERCP); with sphincterotomy/papillotomy                       [phone], Endoscopic catheterization of the biliary ductal                        system, radiological supervision and interpretation Diagnosis Code(s):    --- Professional ---                       K80.50, Calculus of bile duct without cholangitis or                        cholecystitis without obstruction CPT copyright 2019 [employer] Association. All rights reserved. The codes documented in this report are preliminary and upon coder review may  be revised to meet current compliance requirements. Midge Miniumarren Jamayah Myszka MD, MD 12/19/2018 11:17:05 AM This report has been signed electronically. Number of Addenda: 0 Note Initiated On: 12/19/2018 10:25 AM      [facility]

## 2018-12-19 NOTE — ED Notes (Signed)
Pt uprite on stretcher in exam room with no distress noted; reports last several days having intermittent generalized sharp/burning abd pain, nonradiating with no accomp symptoms; abd soft/nondist with diffuse discomfort upon palpation; pt assisted into hosp gown

## 2018-12-19 NOTE — Anesthesia Preprocedure Evaluation (Addendum)
Anesthesia Evaluation  Patient identified by MRN, date of birth, ID band Patient awake    Reviewed: Allergy & Precautions, H&P , NPO status , Patient's Chart, lab work & pertinent test results, reviewed documented beta blocker date and time   History of Anesthesia Complications Negative for: history of anesthetic complications  Airway Mallampati: III  TM Distance: >3 FB Neck ROM: full    Dental  (+) Dental Advidsory Given, Chipped   Pulmonary neg pulmonary ROS,           Cardiovascular Exercise Tolerance: Good hypertension, (-) angina(-) Past MI and (-) Cardiac Stents (-) dysrhythmias (-) Valvular Problems/Murmurs     Neuro/Psych negative neurological ROS  negative psych ROS   GI/Hepatic negative GI ROS, Neg liver ROS,   Endo/Other  negative endocrine ROS  Renal/GU negative Renal ROS  negative genitourinary   Musculoskeletal   Abdominal   Peds  Hematology negative hematology ROS (+)   Anesthesia Other Findings Past Medical History: No date: HTN (hypertension)   Reproductive/Obstetrics negative OB ROS                            Anesthesia Physical Anesthesia Plan  ASA: II  Anesthesia Plan: General   Post-op Pain Management:    Induction: Intravenous  PONV Risk Score and Plan: 3 and Propofol infusion and TIVA  Airway Management Planned: Natural Airway and Nasal Cannula  Additional Equipment:   Intra-op Plan:   Post-operative Plan:   Informed Consent: I have reviewed the patients History and Physical, chart, labs and discussed the procedure including the risks, benefits and alternatives for the proposed anesthesia with the patient or authorized representative who has indicated his/her understanding and acceptance.     Dental Advisory Given  Plan Discussed with: Anesthesiologist, CRNA and Surgeon  Anesthesia Plan Comments:         Anesthesia Quick Evaluation

## 2018-12-19 NOTE — Plan of Care (Signed)
Npo for ercp this noon by dr Servando Snare Problem: Education: Goal: Knowledge of General Education information will improve Description Including pain rating scale, medication(s)/side effects and non-pharmacologic comfort measures Outcome: Progressing   Problem: Pain Managment: Goal: General experience of comfort will improve Outcome: Progressing   Problem: Safety: Goal: Ability to remain free from injury will improve Outcome: Progressing   Problem: Skin Integrity: Goal: Risk for impaired skin integrity will decrease Outcome: Progressing

## 2018-12-19 NOTE — ED Notes (Signed)
This RN spoke with Dr. Servando Snare, per Dr. Servando Snare, pt to go for ERCP at approx 1200 today. Keep NPO.

## 2018-12-19 NOTE — H&P (Signed)
Tina Cummings is an 38 y.o. female.   Chief Complaint: Abdominal pain HPI: The patient with past medical history of hypertension presents to the emergency department complaining of abdominal pain.  The patient reports worsening right upper quadrant pain x3 days.  She denies nausea, vomiting or diarrhea.  She also denies fever.  She has had episodes of pain similar to this for the last 3 weeks.  Laboratory evaluation in the emergency department revealed elevated liver enzymes. Ultrasound showed bilateral stones as well as choledocholithiasis.  She was given a dose of Zosyn in the emergency department prior to the hospitalist service being called for admission.  Past Medical History:  Diagnosis Date  . HTN (hypertension)     Past Surgical History:  Procedure Laterality Date  . TONSILLECTOMY AND ADENOIDECTOMY    . UMBILICAL HERNIA REPAIR      Family History  Problem Relation Age of Onset  . Diabetes Mellitus II Brother    Social History:  reports that she has never smoked. She has never used smokeless tobacco. She reports previous alcohol use. She reports previous drug use.  Allergies: No Known Allergies  Prior to Admission medications   Medication Sig Start Date End Date Taking? Authorizing Provider  hydrochlorothiazide (HYDRODIURIL) 25 MG tablet Take 25 mg by mouth daily. 02/13/18  Yes [provider]  medroxyPROGESTERone (DEPO-PROVERA) 150 MG/ML injection Inject 150 mg into the muscle every 3 (three) months. 04/10/15  Yes [provider]     Results for orders placed or performed during the hospital encounter of 12/19/18 (from the past 48 hour(s))  Basic metabolic panel     Status: Abnormal   Collection Time: 12/19/18 12:18 AM  Result Value Ref Range   Sodium 143 135 - 145 mmol/L   Potassium 3.6 3.5 - 5.1 mmol/L   Chloride 107 98 - 111 mmol/L   CO2 27 22 - 32 mmol/L   Glucose, Bld 105 (H) 70 - 99 mg/dL   BUN 7 6 - 20 mg/dL   Creatinine, Ser 8.11 0.44 - 1.00 mg/dL    Calcium 9.3 8.9 - 91.4 mg/dL   GFR calc non Af Amer >60 >60 mL/min   GFR calc Af Amer >60 >60 mL/min   Anion gap 9 5 - 15    Comment: Performed at [facility], [address] [place], [place] [address]  CBC     Status: None   Collection Time: 12/19/18 12:18 AM  Result Value Ref Range   WBC 6.9 4.0 - 10.5 K/uL   RBC 4.49 3.87 - 5.11 MIL/uL   Hemoglobin 12.6 12.0 - 15.0 g/dL   HCT 62.1 30.8 - 65.7 %   MCV 88.4 80.0 - 100.0 fL   MCH 28.1 26.0 - 34.0 pg   MCHC 31.7 30.0 - 36.0 g/dL   RDW 84.6 96.2 - 95.2 %   Platelets 220 150 - 400 K/uL   nRBC 0.0 0.0 - 0.2 %    Comment: Performed at [facility], [address] [place], [place] [address]  Troponin I - ONCE - STAT     Status: None   Collection Time: 12/19/18 12:18 AM  Result Value Ref Range   Troponin I <0.03 <0.03 ng/mL    Comment: Performed at [facility], [address] [place], [place] [address]  Hepatic function panel     Status: Abnormal   Collection Time: 12/19/18 12:18 AM  Result Value Ref Range   Total Protein 7.6 6.5 - 8.1 g/dL  Albumin 4.0 3.5 - 5.0 g/dL   AST 574 (H) 15 - 41 U/L   ALT 184 (H) 0 - 44 U/L   Alkaline Phosphatase 123 38 - 126 U/L   Total Bilirubin 3.4 (H) 0.3 - 1.2 mg/dL   Bilirubin, Direct 1.5 (H) 0.0 - 0.2 mg/dL   Indirect Bilirubin 1.9 (H) 0.3 - 0.9 mg/dL    Comment: Performed at [facility], [address] [place], [place] [address]  Lipase, blood     Status: None   Collection Time: 12/19/18 12:18 AM  Result Value Ref Range   Lipase 41 11 - 51 U/L    Comment: Performed at [facility], [address] [place], [place] [address]  Pregnancy, urine POC     Status: None   Collection Time: 12/19/18 12:27 AM  Result Value Ref Range   Preg Test, Ur NEGATIVE NEGATIVE    Comment:        THE SENSITIVITY OF THIS METHODOLOGY IS >24 mIU/mL   SARS Coronavirus 2 (CEPHEID - Performed in [facility] hospital lab), Hosp Order     Status: None    Collection Time: 12/19/18  5:10 AM  Result Value Ref Range   SARS Coronavirus 2 NEGATIVE NEGATIVE    Comment: (NOTE) If result is NEGATIVE SARS-CoV-2 target nucleic acids are NOT DETECTED. The SARS-CoV-2 RNA is generally detectable in upper and lower  respiratory specimens during the acute phase of infection. The lowest  concentration of SARS-CoV-2 viral copies this assay can detect is 250  copies / mL. A negative result does not preclude SARS-CoV-2 infection  and should not be used as the sole basis for treatment or other  patient management decisions.  A negative result may occur with  improper specimen collection / handling, submission of specimen other  than nasopharyngeal swab, presence of viral mutation(s) within the  areas targeted by this assay, and inadequate number of viral copies  (<250 copies / mL). A negative result must be combined with clinical  observations, patient history, and epidemiological information. If result is POSITIVE SARS-CoV-2 target nucleic acids are DETECTED. The SARS-CoV-2 RNA is generally detectable in upper and lower  respiratory specimens dur ing the acute phase of infection.  Positive  results are indicative of active infection with SARS-CoV-2.  Clinical  correlation with patient history and other diagnostic information is  necessary to determine patient infection status.  Positive results do  not rule out bacterial infection or co-infection with other viruses. If result is PRESUMPTIVE POSTIVE SARS-CoV-2 nucleic acids MAY BE PRESENT.   A presumptive positive result was obtained on the submitted specimen  and confirmed on repeat testing.  While 2019 novel coronavirus  (SARS-CoV-2) nucleic acids may be present in the submitted sample  additional confirmatory testing may be necessary for epidemiological  and / or clinical management purposes  to differentiate between  SARS-CoV-2 and other Sarbecovirus currently known to infect humans.  If clinically  indicated additional testing with an alternate test  methodology [phone]) is advised. The SARS-CoV-2 RNA is generally  detectable in upper and lower respiratory sp ecimens during the acute  phase of infection. The expected result is Negative. Fact Sheet for Patients:  BoilerBrush.com.cy Fact Sheet for Healthcare Providers: https://pope.com/ This test is not yet approved or cleared by the [place] FDA and has been authorized for detection and/or diagnosis of SARS-CoV-2 by FDA under an Emergency Use Authorization (EUA).  This EUA will remain in effect (meaning this test can be used)  for the duration of the COVID-19 declaration under Section 564(b)(1) of the Act, 21 U.S.C. section 360bbb-3(b)(1), unless the authorization is terminated or revoked sooner. Performed at [facility], [address] [place], [place] [address]    Dg Chest 2 View  Result Date: 12/19/2018 CLINICAL DATA:  Upper abdominal pain EXAM: CHEST - 2 VIEW COMPARISON:  None. FINDINGS: Heart and mediastinal contours are within normal limits. No focal opacities or effusions. No acute bony abnormality. IMPRESSION: No active cardiopulmonary disease. Electronically Signed   By: Charlett Nose M.D.   On: 12/19/2018 02:49   US Abdomen Limited Ruq  Result Date: 12/19/2018 CLINICAL DATA:  Upper abdominal pain and tenderness for several days EXAM: ULTRASOUND ABDOMEN LIMITED RIGHT UPPER QUADRANT COMPARISON:  None. FINDINGS: Gallbladder: Gallbladder is distended and contains layering calculi. A stone is seen at the level of the gallbladder neck that is fixed. Gallbladder wall is thickened to 6 mm and striated by edema. Common bile duct: Diameter: 8 mm. A stone is seen in the distal common bile duct measuring 6 mm. Liver: No focal lesion identified. Within normal limits in parenchymal echogenicity. Portal vein is patent on color Doppler imaging with normal direction of blood flow  towards the liver. IMPRESSION: 1. Cholelithiasis. Except for Eulah Pont sign there are findings of acute cholecystitis. 2. Choledocholithiasis with dilated bile ducts. Electronically Signed   By: Marnee Spring M.D.   On: 12/19/2018 04:35    Review of Systems  Constitutional: Negative for chills and fever.  HENT: Negative for sore throat and tinnitus.   Eyes: Negative for blurred vision and redness.  Respiratory: Negative for cough and shortness of breath.   Cardiovascular: Negative for chest pain, palpitations, orthopnea and PND.  Gastrointestinal: Negative for abdominal pain, diarrhea, nausea and vomiting.  Genitourinary: Negative for dysuria, frequency and urgency.  Musculoskeletal: Negative for joint pain and myalgias.  Skin: Negative for rash.       No lesions  Neurological: Negative for speech change, focal weakness and weakness.  Endo/Heme/Allergies: Does not bruise/bleed easily.       No temperature intolerance  Psychiatric/Behavioral: Negative for depression and suicidal ideas.    Blood pressure 139/89, pulse 73, temperature 99.3 F (37.4 C), temperature source Oral, resp. rate 18, height  (1.727 m), weight 81.6 kg, SpO2 90 %. Physical Exam  Vitals reviewed. Constitutional: She is oriented to person, place, and time. She appears well-developed and well-nourished. No distress.  HENT:  Head: Normocephalic and atraumatic.  Mouth/Throat: Oropharynx is clear and moist.  Eyes: Pupils are equal, round, and reactive to light. Conjunctivae and EOM are normal. No scleral icterus.  Neck: Normal range of motion. Neck supple. No JVD present. No tracheal deviation present. No thyromegaly present.  Cardiovascular: Normal rate, regular rhythm and normal heart sounds. Exam reveals no gallop and no friction rub.  No murmur heard. Respiratory: Effort normal and breath sounds normal.  GI: Soft. Bowel sounds are normal. She exhibits no distension. There is no abdominal tenderness.   Genitourinary:    Genitourinary Comments: Deferred   Musculoskeletal: Normal range of motion.        General: No edema.  Lymphadenopathy:    She has no cervical adenopathy.  Neurological: She is alert and oriented to person, place, and time. No cranial nerve deficit. She exhibits normal muscle tone.  Skin: Skin is warm and dry. No rash noted. No erythema.  Psychiatric: She has a normal mood and affect. Her behavior is normal. Judgment and thought content normal.  Assessment/Plan This is a 38 year old female admitted for cholecystitis. 1.  Cholecystitis: With choledocholithiasis; no signs or symptoms of sepsis.  Will consult surgery if needed for cholecystectomy. 2.  Transaminitis: Consult gastroenterology for ERCP.  No pancreatitis present 3.  Hypertension: Uncontrolled; continue hydrochlorothiazide.  Labetalol as needed. 4.  Overweight: BMI is 27; encouraged healthy diet and exercise 5.  DVT prophylaxis: Heparin 6.  GI prophylaxis: None The patient is a full code.  Time spent on admission orders and patient care approximately 45 minutes  Arnaldo Nataliamond,  Patriece Archbold S, MD 12/19/2018, 7:22 AM

## 2018-12-19 NOTE — ED Provider Notes (Signed)
[facility] Emergency Department Provider Note  ____________________________________________   First MD Initiated Contact with Patient 12/19/18 [phone]     (approximate)  I have reviewed the triage vital signs and the nursing notes.   HISTORY  Chief Complaint Abdominal Pain    HPI Tina Cummings is a 38 y.o. female with no chronic medical issues who presents for evaluation of persistent aching and intermittently severe upper abdominal pain that is slightly worse on the left side.  It is been going on for somewhere between 24 to 48 hours.  It was a gradual in onset discomfort that became more severe.  She has had decreased appetite and is not certain if the pain is worse after she eats because she has not been eating very much.  She denies heavy alcohol use.  She has not had this problem in the past other than one episode that was similar to it about 3 weeks ago that resolved on its own.  She denies fever/chills, cough, sore throat, chest pain, shortness of breath, and she has not been around any patient is known to have COVID-19.  She denies diarrhea and vomiting.  Possibly some nausea.  She describes the pain as an aching and dull pain.  It does not radiate although it involves both sides of her upper abdomen but worse on the left.   She denies any history of trauma.        History reviewed. No pertinent past medical history.  Patient Active Problem List   Diagnosis Date Noted  . Cholecystitis 12/19/2018    History reviewed. No pertinent surgical history.  Prior to Admission medications   Medication Sig Start Date End Date Taking? Authorizing Provider  hydrochlorothiazide (HYDRODIURIL) 25 MG tablet Take 25 mg by mouth daily. 02/13/18  Yes [provider]  medroxyPROGESTERone (DEPO-PROVERA) 150 MG/ML injection Inject 150 mg into the muscle every 3 (three) months. 04/10/15  Yes [provider]    Allergies Patient has no known allergies.  No  family history on file.  Social History Social History   Tobacco Use  . Smoking status: Never Smoker  . Smokeless tobacco: Never Used  Substance Use Topics  . Alcohol use: Not Currently  . Drug use: Not Currently    Review of Systems Constitutional: No fever/chills Eyes: No visual changes. ENT: No sore throat. Cardiovascular: Denies chest pain. Respiratory: Denies shortness of breath. Gastrointestinal: Upper abdominal pain worse on the left as described above. Genitourinary: Negative for dysuria. Musculoskeletal: Negative for neck pain.  Negative for back pain. Integumentary: Negative for rash. Neurological: Negative for headaches, focal weakness or numbness.   ____________________________________________   PHYSICAL EXAM:  VITAL SIGNS: ED Triage Vitals  Enc Vitals Group     BP 12/19/18 0010 (!) 137/102     Pulse Rate 12/19/18 0010 77     Resp 12/19/18 0010 20     Temp 12/19/18 0010 99.3 F (37.4 C)     Temp Source 12/19/18 0010 Oral     SpO2 12/19/18 0010 99 %     Weight 12/19/18 0007 81.6 kg (180 lb)     Height 12/19/18 0007 1.727 m (5\' 8" )     Head Circumference --      Peak Flow --      Pain Score 12/19/18 0007 6     Pain Loc --      Pain Edu? --      Excl. in GC? --     Constitutional: Alert and  oriented. Well appearing and in no acute distress although she does appear little bit uncomfortable. Eyes: Conjunctivae are normal.  Head: Atraumatic. Nose: No congestion/rhinnorhea. Mouth/Throat: Mucous membranes are moist. Neck: No stridor.  No meningeal signs.   Cardiovascular: Normal rate, regular rhythm. Good peripheral circulation. Grossly normal heart sounds. Respiratory: Normal respiratory effort.  No retractions. No audible wheezing. Gastrointestinal: Soft and nondistended.  Tenderness to palpation in the epigastrium and right upper quadrant with positive Murphy sign but also tenderness in the left upper quadrant in the area of the spleen.  Musculoskeletal: No lower extremity tenderness nor edema. No gross deformities of extremities. Neurologic:  Normal speech and language. No gross focal neurologic deficits are appreciated.  Skin:  Skin is warm, dry and intact. No rash noted. Psychiatric: Mood and affect are normal. Speech and behavior are normal.  ____________________________________________   LABS (all labs ordered are listed, but only abnormal results are displayed)  Labs Reviewed  BASIC METABOLIC PANEL - Abnormal; Notable for the following components:      Result Value   Glucose, Bld 105 (*)    All other components within normal limits  HEPATIC FUNCTION PANEL - Abnormal; Notable for the following components:   AST 184 (*)    ALT 184 (*)    Total Bilirubin 3.4 (*)    Bilirubin, Direct 1.5 (*)    Indirect Bilirubin 1.9 (*)    All other components within normal limits  SARS CORONAVIRUS 2 (HOSPITAL ORDER, PERFORMED IN  HOSPITAL LAB)  CBC  TROPONIN I  LIPASE, BLOOD  POC URINE PREG, ED  POCT PREGNANCY, URINE   ____________________________________________  EKG  ED ECG REPORT I, Loleta Rose, the attending physician, personally viewed and interpreted this ECG.  Date: 12/19/2018 EKG Time: 00: 13 Rate: 75 Rhythm: normal sinus rhythm QRS Axis: normal Intervals: normal ST/T Wave abnormalities: Non-specific ST segment / T-wave changes, but no clear evidence of acute ischemia. Narrative Interpretation: no definitive evidence of acute ischemia; does not meet STEMI criteria.   ____________________________________________  RADIOLOGY I, Loleta Rose, personally viewed and evaluated these images (plain radiographs) as part of my medical decision making, as well as reviewing the written report by the radiologist.  ED MD interpretation: No abnormalities on chest x-ray.  Choledocholithiasis and cholecystitis on right upper quadrant ultrasound.  Official radiology report(s): Dg Chest 2 View  Result  Date: 12/19/2018 CLINICAL DATA:  Upper abdominal pain EXAM: CHEST - 2 VIEW COMPARISON:  None. FINDINGS: Heart and mediastinal contours are within normal limits. No focal opacities or effusions. No acute bony abnormality. IMPRESSION: No active cardiopulmonary disease. Electronically Signed   By: Charlett Nose M.D.   On: 12/19/2018 02:49   US Abdomen Limited Ruq  Result Date: 12/19/2018 CLINICAL DATA:  Upper abdominal pain and tenderness for several days EXAM: ULTRASOUND ABDOMEN LIMITED RIGHT UPPER QUADRANT COMPARISON:  None. FINDINGS: Gallbladder: Gallbladder is distended and contains layering calculi. A stone is seen at the level of the gallbladder neck that is fixed. Gallbladder wall is thickened to 6 mm and striated by edema. Common bile duct: Diameter: 8 mm. A stone is seen in the distal common bile duct measuring 6 mm. Liver: No focal lesion identified. Within normal limits in parenchymal echogenicity. Portal vein is patent on color Doppler imaging with normal direction of blood flow towards the liver. IMPRESSION: 1. Cholelithiasis. Except for Eulah Pont sign there are findings of acute cholecystitis. 2. Choledocholithiasis with dilated bile ducts. Electronically Signed   By: Kathrynn Ducking.D.  On: 12/19/2018 04:35    ____________________________________________   PROCEDURES   Procedure(s) performed (including Critical Care):  Procedures   ____________________________________________   INITIAL IMPRESSION / MDM / ASSESSMENT AND PLAN / ED COURSE  As part of my medical decision making, I reviewed the following data within the electronic MEDICAL RECORD NUMBER Nursing notes reviewed and incorporated, Labs reviewed , Old chart reviewed, Radiograph reviewed , Discussed with admitting physician (Dr. Sheryle Hail), Discussed with general surgery (Dr. Lady Gary), discussed with gastroenterology (Dr. Allegra Lai) and reviewed Notes from prior ED visits      *Tina Cummings was evaluated in Emergency Department on  12/19/2018 for the symptoms described in the history of present illness. She was evaluated in the context of the global COVID-19 pandemic, which necessitated consideration that the patient might be at risk for infection with the SARS-CoV-2 virus that causes COVID-19. Institutional protocols and algorithms that pertain to the evaluation of patients at risk for COVID-19 are in a state of rapid change based on information released by regulatory bodies including the CDC and federal and state organizations. These policies and algorithms were followed during the patient's care in the ED.*  Differential diagnosis includes, but is not limited to, biliary disease, pancreatitis, splenic infarction or infection, less likely ACS.  Acid reflux is also possible but I doubt it based on the tenderness on physical exam.  I suspect she has gallstones and possibly with cholecystitis.  Her vital signs are stable she has a very slightly elevated temperature but it does not qualify as a fever.  Her presentation is not consistent with cholangitis.  I added on hepatic function panel and lipase to the labs that were drawn in triage.  Her CBC and basic metabolic panel and troponin were all within normal limits but I am concerned about her liver function test.  I have ordered a right upper quadrant ultrasound and will also obtain a two-view chest x-ray given that the pain is up high in her abdomen and could actually represent lower lobe lung issues but she is not having any respiratory symptoms and I think this is less likely.  The patient understands and agrees with the plan and does not need analgesia at this time.  Clinical Course as of Dec 18 632  Wed Dec 19, 2018  0237 Abnormal hepatic function panel with elevated AST and ALT with a total bilirubin of 3.4, direct bilirubin of 1.5, and indirect bilirubin of 1.9.  Ultrasound is pending.  Hepatic function panel(!) [CF]  [id number] Normal lipase, normal basic metabolic panel, normal CBC,  normal troponin, urine pregnancy test is negative.   [CF]  0305 Normal chest x-ray  DG Chest 2 View [CF]  0451 Ultrasound indicates choledocholithiasis as well as probable cholecystitis.  I discussed the case in person with Dr. Lady Gary with general surgery who recommended medical admission with GI consult and MRCP versus ERCP and then surgical consultation.  I have paged to Dr. Allegra Lai with GI to discuss the case to make sure that Dr. Servando Snare will be available for ERCP.   [CF]  0502 I spoke by phone with Dr. Allegra Lai.  She advised that there is no need for an MRCP given the results on the ultrasound.  She recommended medical admission and Dr. Servando Snare will be available for ERCP.  She agreed with my plan for an empiric dose of Zosyn 3.375 g IV.  I have paged the hospitalist to discuss admission.  I updated the patient.   [CF]  0503 The patient is  having increasing abdominal pain.  I have ordered morphine 4 mg IV, Zofran 4 mg IV, 1 L normal saline bolus, Zosyn 3.375 g IV, and asked her to remain n.p.o.   [CF]  [id number] I spoke by phone with Dr. Sheryle Haildiamond.  We discussed the case and he agrees with the plan for admission.   [CF]    Clinical Course User Index [CF] Loleta RoseForbach, Airabella Barley, MD     ____________________________________________  FINAL CLINICAL IMPRESSION(S) / ED DIAGNOSES  Final diagnoses:  Choledocholithiasis  Acute cholecystitis  Elevated bilirubin  Elevated LFTs     MEDICATIONS GIVEN DURING THIS VISIT:  Medications  sodium chloride flush (NS) 0.9 % injection 3 mL (has no administration in time range)  morphine 4 MG/ML injection 4 mg (4 mg Intravenous Given 12/19/18 0515)  ondansetron (ZOFRAN) injection 4 mg (4 mg Intravenous Given 12/19/18 0515)  sodium chloride 0.9 % bolus 1,000 mL (1,000 mLs Intravenous New Bag/Given 12/19/18 0514)  piperacillin-tazobactam (ZOSYN) IVPB 3.375 g (0 g Intravenous Stopped 12/19/18 0545)     ED Discharge Orders    None       Note:  This document was prepared using  Dragon voice recognition software and may include unintentional dictation errors.   Loleta RoseForbach, Catharina Pica, MD 12/19/18 [phone]

## 2018-12-20 ENCOUNTER — Encounter: Payer: Self-pay | Admitting: Gastroenterology

## 2018-12-20 DIAGNOSIS — K819 Cholecystitis, unspecified: Secondary | ICD-10-CM

## 2018-12-20 LAB — COMPREHENSIVE METABOLIC PANEL
ALT: 154 U/L — ABNORMAL HIGH (ref 0–44)
AST: 75 U/L — ABNORMAL HIGH (ref 15–41)
Albumin: 3.4 g/dL — ABNORMAL LOW (ref 3.5–5.0)
Alkaline Phosphatase: 107 U/L (ref 38–126)
Anion gap: 8 (ref 5–15)
BUN: <5 mg/dL — ABNORMAL LOW (ref 6–20)
CO2: 24 mmol/L (ref 22–32)
Calcium: 8.4 mg/dL — ABNORMAL LOW (ref 8.9–10.3)
Chloride: 106 mmol/L (ref 98–111)
Creatinine, Ser: 0.72 mg/dL (ref 0.44–1.00)
GFR calc Af Amer: >60 mL/min (ref >60)
GFR calc non Af Amer: >60 mL/min (ref >60)
Glucose, Bld: 108 mg/dL — ABNORMAL HIGH (ref 70–99)
Potassium: 3.1 mmol/L — ABNORMAL LOW (ref 3.5–5.1)
Sodium: 138 mmol/L (ref 135–145)
Total Bilirubin: 1.9 mg/dL — ABNORMAL HIGH (ref 0.3–1.2)
Total Protein: 6.4 g/dL — ABNORMAL LOW (ref 6.5–8.1)

## 2018-12-20 LAB — LIPASE, BLOOD: Lipase: 943 U/L — ABNORMAL HIGH (ref 11–51)

## 2018-12-20 MED ORDER — POTASSIUM CHLORIDE CRYS ER 20 MEQ PO TBCR
40.0000 meq | EXTENDED_RELEASE_TABLET | Freq: Once | ORAL | Status: AC
  Administered 2018-12-20: 40 meq via ORAL

## 2018-12-20 MED ORDER — POTASSIUM CHLORIDE CRYS ER 20 MEQ PO TBCR
40.0000 meq | EXTENDED_RELEASE_TABLET | Freq: Once | ORAL | Status: DC
  Filled 2018-12-20: qty 2

## 2018-12-20 NOTE — Progress Notes (Signed)
CC: Cholecystitis and choledocholithiasis s/p ERCP   Subjective: Some new pain epigastric area, mild . Lipase 943 Decrease appetite but tolerating some clears  Objective: Vital signs in last 24 hours: Temp:  [98.7 F (37.1 C)-99.1 F (37.3 C)] 99.1 F (37.3 C) (05/07 1508) Pulse Rate:  [56-80] 80 (05/07 1508) Resp:  [20] 20 (05/07 1508) BP: (135-153)/(90-113) 135/90 (05/07 1508) SpO2:  [97 %-98 %] 98 % (05/07 1508) Weight:  [83.4 kg] 83.4 kg (05/07 0500) Last BM Date: 12/17/18  Intake/Output from previous day: 05/06 0701 - 05/07 0700 In: 2256.3 [I.V.:2256.3] Out: 1901 [Urine:1901] Intake/Output this shift: Total I/O In: 480 [P.O.:480] Out: -   Physical exam: NAD, awake and  Alert Abd: soft, mild ttp epigastric area, no peritonitis  Lab Results: CBC  Recent Labs    12/19/18 0018  WBC 6.9  HGB 12.6  HCT 39.7  PLT 220   BMET Recent Labs    12/19/18 0018 12/20/18 0254  NA 143 138  K 3.6 3.1*  CL 107 106  CO2 27 24  GLUCOSE 105* 108*  BUN 7 <5*  CREATININE 0.77 0.72  CALCIUM 9.3 8.4*   PT/INR No results for input(s): LABPROT, INR in the last 72 hours. ABG No results for input(s): PHART, HCO3 in the last 72 hours.  Invalid input(s): PCO2, PO2  Studies/Results: Dg Chest 2 View  Result Date: 12/19/2018 CLINICAL DATA:  Upper abdominal pain EXAM: CHEST - 2 VIEW COMPARISON:  None. FINDINGS: Heart and mediastinal contours are within normal limits. No focal opacities or effusions. No acute bony abnormality. IMPRESSION: No active cardiopulmonary disease. Electronically Signed   By: Charlett Nose M.D.   On: 12/19/2018 02:49   Dg C-arm 1-60 Min-no Report  Result Date: 12/19/2018 Fluoroscopy was utilized by the requesting physician.  No radiographic interpretation.   US Abdomen Limited Ruq  Result Date: 12/19/2018 CLINICAL DATA:  Upper abdominal pain and tenderness for several days EXAM: ULTRASOUND ABDOMEN LIMITED RIGHT UPPER QUADRANT COMPARISON:  None. FINDINGS:  Gallbladder: Gallbladder is distended and contains layering calculi. A stone is seen at the level of the gallbladder neck that is fixed. Gallbladder wall is thickened to 6 mm and striated by edema. Common bile duct: Diameter: 8 mm. A stone is seen in the distal common bile duct measuring 6 mm. Liver: No focal lesion identified. Within normal limits in parenchymal echogenicity. Portal vein is patent on color Doppler imaging with normal direction of blood flow towards the liver. IMPRESSION: 1. Cholelithiasis. Except for Eulah Pont sign there are findings of acute cholecystitis. 2. Choledocholithiasis with dilated bile ducts. Electronically Signed   By: Marnee Spring M.D.   On: 12/19/2018 04:35    Anti-infectives: Anti-infectives (From admission, onward)   Start     Dose/Rate Route Frequency Ordered Stop   12/19/18 0500  piperacillin-tazobactam (ZOSYN) IVPB 3.375 g     3.375 g 100 mL/hr over 30 Minutes Intravenous  Once 12/19/18 0451 12/19/18 0545      Assessment/Plan:  Cholecystitis and choledocholithiasis s/p ERCP and mild pancreatitis We will recheck lipase and depending on clinical condition may proceed w lap chole tomorrow Procedure d/w the pt in detail.  Dr. Aleen Campi rounding tomorrow and if lipase is trending down he will do lap chole.  Sterling Big, MD, [facility]  12/20/2018

## 2018-12-20 NOTE — Progress Notes (Signed)
GI Inpatient Follow-up Note  Subjective:  Patient seen and examined this morning for acute calculous cholecystitis and choledocholithiasis. She had ERCP performed by Dr. Servando Snare yesterday with successful removal of stones via biliary sphincterotomy and balloon extraction (3 stones in total). Labs prior to ERCP were significant for AST 184, ALT 184, and total bilirubin 3.4. Repeat laps this morning are trending down with AST 75, ALT 154, and total bilirubin 1.9. However, lipase was found to be 943. She reports abdominal pain is currently 3/10, located in epigastric region and described as aching. She reports she has not required pain medication today. She was able to tolerate a clear liquid diet for lunch and had some chicken broth. She denies any nausea or vomiting. No bowel movements overnight or this morning.   Scheduled Inpatient Medications:  . docusate sodium  100 mg Oral BID  . heparin  5,000 Units Subcutaneous Q8H  . hydrochlorothiazide  25 mg Oral Daily  . indomethacin  100 mg Rectal Once  . potassium chloride  40 mEq Oral Once  . sodium chloride flush  3 mL Intravenous Once    Continuous Inpatient Infusions:   . sodium chloride 150 mL/hr at 12/20/18 0858    PRN Inpatient Medications:  acetaminophen **OR** acetaminophen, labetalol, morphine injection, ondansetron **OR** ondansetron (ZOFRAN) IV  Review of Systems: Constitutional: Weight is stable.  Eyes: No changes in vision. ENT: No oral lesions, sore throat.  GI: see HPI.  Heme/Lymph: No easy bruising.  CV: No chest pain.  GU: No hematuria.  Integumentary: No rashes.  Neuro: No headaches.  Psych: No depression/anxiety.  Endocrine: No heat/cold intolerance.  Allergic/Immunologic: No urticaria.  Resp: No cough, SOB.  Musculoskeletal: No joint swelling.    Physical Examination: BP (!) 153/102 (BP Location: Left Arm)   Pulse 70   Temp 98.9 F (37.2 C) (Oral)   Resp 16   Ht 5\' 8"  (1.727 m)   Wt 83.4 kg   SpO2 98%    BMI 27.96 kg/m  Gen: NAD, alert and oriented x 4 HEENT: PEERLA, EOMI, Neck: supple, no JVD or thyromegaly Chest: CTA bilaterally, no wheezes, crackles, or other adventitious sounds CV: RRR, no m/g/c/r Abd: soft, mild TTP in epigastric region, ND, +BS in all four quadrants; no HSM, guarding, ridigity, or rebound tenderness Ext: no edema, well perfused with 2+ pulses, Skin: no rash or lesions noted Lymph: no LAD  Data: Lab Results  Component Value Date   WBC 6.9 12/19/2018   HGB 12.6 12/19/2018   HCT 39.7 12/19/2018   MCV 88.4 12/19/2018   PLT 220 12/19/2018   Recent Labs  Lab 12/19/18 0018  HGB 12.6   Lab Results  Component Value Date   NA 138 12/20/2018   K 3.1 (L) 12/20/2018   CL 106 12/20/2018   CO2 24 12/20/2018   BUN <5 (L) 12/20/2018   CREATININE 0.72 12/20/2018   Lab Results  Component Value Date   ALT 154 (H) 12/20/2018   AST 75 (H) 12/20/2018   ALKPHOS 107 12/20/2018   BILITOT 1.9 (H) 12/20/2018   No results for input(s): APTT, INR, PTT in the last 168 hours. Assessment/Plan:  38 y/o AA female with a PMH of HTN admitted for acute calculous cholecystitis with choledocholithiasis   1. Acute calculous cholecystitis 2. Choledocholithiasis - s/p ERCP yesterday with Dr. Servando Snare with extraction of stones, lipase 943 today 3. Post-ERCP pancreatitis - Lipase 933, some mild abdominal pain today 4. Elevated LFTs - improving  - Continue clear  liquid diet - WBC not checked today. Recheck CBC and CMP in the AM - Continue supportive care with IV fluids and pain control - Patient will need lap cholecystectomy after resolution of post-ERCP pancreatitis and before discharge from hospital - Surgery is following and defer to their recommendations   Please call with questions or concerns.    Jacob MooresMason , PA-C [facility] [phone] (Cell)

## 2018-12-20 NOTE — Progress Notes (Signed)
[employer] [facility] [place] at [facility]     PATIENT NAME: Tina Cummings    MR#:  [id number]  DATE OF BIRTH:  03-Sep-1980  SUBJECTIVE:   Status post ERCP yesterday with extraction of 2 stones.  Having some vague abdominal pain and lipase is elevated consistent with post ERCP pancreatitis.  No nausea or vomiting.  REVIEW OF SYSTEMS:    Review of Systems  Constitutional: Negative for chills and fever.  HENT: Negative for congestion and tinnitus.   Eyes: Negative for blurred vision and double vision.  Respiratory: Negative for cough, shortness of breath and wheezing.   Cardiovascular: Negative for chest pain, orthopnea and PND.  Gastrointestinal: Positive for abdominal pain. Negative for diarrhea, nausea and vomiting.  Genitourinary: Negative for dysuria and hematuria.  Neurological: Negative for dizziness, sensory change and focal weakness.  All other systems reviewed and are negative.   Nutrition: Clear liquids Tolerating Diet: Yes Tolerating PT: Ambulatory     DRUG ALLERGIES:  No Known Allergies  VITALS:  Blood pressure (!) 153/102, pulse 70, temperature 98.9 F (37.2 C), temperature source Oral, resp. rate 16, height 5\' 8"  (1.727 m), weight 83.4 kg, SpO2 98 %.  PHYSICAL EXAMINATION:   Physical Exam  GENERAL:  38 y.o.-year-old patient lying in bed in no acute distress.  EYES: Pupils equal, round, reactive to light and accommodation. No scleral icterus. Extraocular muscles intact.  HEENT: Head atraumatic, normocephalic. Oropharynx and nasopharynx clear.  NECK:  Supple, no jugular venous distention. No thyroid enlargement, no tenderness.  LUNGS: Normal breath sounds bilaterally, no wheezing, rales, rhonchi. No use of accessory muscles of respiration.  CARDIOVASCULAR: S1, S2 normal. No murmurs, rubs, or gallops.  ABDOMEN: Soft, tender in RUQ/epigastric area, nondistended. Bowel sounds present. No organomegaly or mass.  EXTREMITIES: No cyanosis, clubbing or  edema b/l.    NEUROLOGIC: Cranial nerves II through XII are intact. No focal Motor or sensory deficits b/l.   PSYCHIATRIC: The patient is alert and oriented x 3.  SKIN: No obvious rash, lesion, or ulcer.    LABORATORY PANEL:   CBC Recent Labs  Lab 12/19/18 0018  WBC 6.9  HGB 12.6  HCT 39.7  PLT 220   ------------------------------------------------------------------------------------------------------------------  Chemistries  Recent Labs  Lab 12/20/18 0254  NA 138  K 3.1*  CL 106  CO2 24  GLUCOSE 108*  BUN <5*  CREATININE 0.72  CALCIUM 8.4*  AST 75*  ALT 154*  ALKPHOS 107  BILITOT 1.9*   ------------------------------------------------------------------------------------------------------------------  Cardiac Enzymes Recent Labs  Lab 12/19/18 0018  TROPONINI <0.03   ------------------------------------------------------------------------------------------------------------------  RADIOLOGY:  Dg Chest 2 View  Result Date: 12/19/2018 CLINICAL DATA:  Upper abdominal pain EXAM: CHEST - 2 VIEW COMPARISON:  None. FINDINGS: Heart and mediastinal contours are within normal limits. No focal opacities or effusions. No acute bony abnormality. IMPRESSION: No active cardiopulmonary disease. Electronically Signed   By: Charlett Nose M.D.   On: 12/19/2018 02:49   Dg C-arm 1-60 Min-no Report  Result Date: 12/19/2018 Fluoroscopy was utilized by the requesting physician.  No radiographic interpretation.   US Abdomen Limited Ruq  Result Date: 12/19/2018 CLINICAL DATA:  Upper abdominal pain and tenderness for several days EXAM: ULTRASOUND ABDOMEN LIMITED RIGHT UPPER QUADRANT COMPARISON:  None. FINDINGS: Gallbladder: Gallbladder is distended and contains layering calculi. A stone is seen at the level of the gallbladder neck that is fixed. Gallbladder wall is thickened to 6 mm and striated by edema. Common bile duct: Diameter: 8 mm. A stone is  seen in the distal common bile duct  measuring 6 mm. Liver: No focal lesion identified. Within normal limits in parenchymal echogenicity. Portal vein is patent on color Doppler imaging with normal direction of blood flow towards the liver. IMPRESSION: 1. Cholelithiasis. Except for Eulah PontMurphy sign there are findings of acute cholecystitis. 2. Choledocholithiasis with dilated bile ducts. Electronically Signed   By: Marnee SpringJonathon  Watts M.D.   On: 12/19/2018 04:35     ASSESSMENT AND PLAN:   38 year old female with past medical history of hypertension who presented to the hospital due to abdominal pain and noted to have choledocholithiasis with findings suggestive of acute cholecystitis.  1.  Choledocholithiasis/acute cholecystitis-this is the source of patient's abdominal pain admission. - Seen by gastroenterology and status post ERCP today with sphincterotomy and extraction of 2 stones yesterday.  -Afebrile, white cell count is stable.  Patient developed some post ERCP pancreatitis with lipase being elevated.  Plan was for possible lap chole today but is postponed till tomorrow until lipase is improving. -Placed on clear liquid diet, continue supportive care with IV fluids pain control.  Follow LFTs.  2.  Abnormal LFTs- secondary to as mentioned above. -LFTs are improving.  Developed some post ERCP pancreatitis.  Will follow lipase tomorrow.  Continue supportive care as mentioned.  3.  Essential hypertension-continue hydrochlorothiazide.  4.  Hypokalemia- will supplement orally.  Repeat level in the morning.  Check magnesium level in a.m.  Discussed plan of care with Gen. Surgery Dr. Everlene FarrierPabon.   All the records are reviewed and case discussed with Care Management/Social Worker. Management plans discussed with the patient, family and they are in agreement.  CODE STATUS: Full code  DVT Prophylaxis: Hep SQ  TOTAL TIME TAKING CARE OF THIS PATIENT: 30 minutes.   POSSIBLE D/C IN 1-2 DAYS, DEPENDING ON CLINICAL CONDITION.   Houston SirenVivek J Sainani  M.D on 12/20/2018 at 1:30 PM  Between 7am to 6pm - Pager - [phone]  After 6pm go to [email or web address] - Social research officer, governmentpassword EPAS [facility]  [facility] [place] [facility]  Office  [phone]  CC: Primary care physician; Jenell MillinerJohnson, Sally R, MD

## 2018-12-21 ENCOUNTER — Encounter: Payer: Self-pay | Admitting: Anesthesiology

## 2018-12-21 ENCOUNTER — Encounter
Admission: EM | Disposition: A | Discharge status: Home / Self Care | Payer: Self-pay | Source: Home / Self Care | Attending: Specialist

## 2018-12-21 DIAGNOSIS — K8050 Calculus of bile duct without cholangitis or cholecystitis without obstruction: Secondary | ICD-10-CM

## 2018-12-21 DIAGNOSIS — K810 Acute cholecystitis: Secondary | ICD-10-CM

## 2018-12-21 LAB — CBC WITH DIFFERENTIAL/PLATELET
Abs Immature Granulocytes: 0.02 10*3/uL (ref 0.00–0.07)
Basophils Absolute: 0 10*3/uL (ref 0.0–0.1)
Basophils Relative: 0 %
Eosinophils Absolute: 0.2 10*3/uL (ref 0.0–0.5)
Eosinophils Relative: 2 %
HCT: 34.2 % — ABNORMAL LOW (ref 36.0–46.0)
Hemoglobin: 11.1 g/dL — ABNORMAL LOW (ref 12.0–15.0)
Immature Granulocytes: 0 %
Lymphocytes Relative: 18 %
Lymphs Abs: 1.6 10*3/uL (ref 0.7–4.0)
MCH: 28.2 pg (ref 26.0–34.0)
MCHC: 32.5 g/dL (ref 30.0–36.0)
MCV: 86.8 fL (ref 80.0–100.0)
Monocytes Absolute: 0.4 10*3/uL (ref 0.1–1.0)
Monocytes Relative: 4 %
Neutro Abs: 6.7 10*3/uL (ref 1.7–7.7)
Neutrophils Relative %: 76 %
Platelets: 160 10*3/uL (ref 150–400)
RBC: 3.94 MIL/uL (ref 3.87–5.11)
RDW: 13.4 % (ref 11.5–15.5)
WBC: 8.9 10*3/uL (ref 4.0–10.5)
nRBC: 0 % (ref 0.0–0.2)

## 2018-12-21 LAB — COMPREHENSIVE METABOLIC PANEL
ALT: 91 U/L — ABNORMAL HIGH (ref 0–44)
AST: 24 U/L (ref 15–41)
Albumin: 3.3 g/dL — ABNORMAL LOW (ref 3.5–5.0)
Alkaline Phosphatase: 103 U/L (ref 38–126)
Anion gap: 8 (ref 5–15)
BUN: <5 mg/dL — ABNORMAL LOW (ref 6–20)
CO2: 26 mmol/L (ref 22–32)
Calcium: 8.6 mg/dL — ABNORMAL LOW (ref 8.9–10.3)
Chloride: 106 mmol/L (ref 98–111)
Creatinine, Ser: 0.8 mg/dL (ref 0.44–1.00)
GFR calc Af Amer: >60 mL/min (ref >60)
GFR calc non Af Amer: >60 mL/min (ref >60)
Glucose, Bld: 91 mg/dL (ref 70–99)
Potassium: 3.2 mmol/L — ABNORMAL LOW (ref 3.5–5.1)
Sodium: 140 mmol/L (ref 135–145)
Total Bilirubin: 1.9 mg/dL — ABNORMAL HIGH (ref 0.3–1.2)
Total Protein: 6.5 g/dL (ref 6.5–8.1)

## 2018-12-21 LAB — LIPASE, BLOOD: Lipase: 243 U/L — ABNORMAL HIGH (ref 11–51)

## 2018-12-21 SURGERY — LAPAROSCOPIC CHOLECYSTECTOMY
Anesthesia: General

## 2018-12-21 MED ORDER — POTASSIUM CHLORIDE CRYS ER 20 MEQ PO TBCR
40.0000 meq | EXTENDED_RELEASE_TABLET | Freq: Once | ORAL | Status: AC
  Administered 2018-12-21: 40 meq via ORAL
  Filled 2018-12-21: qty 2

## 2018-12-21 MED ORDER — ENOXAPARIN SODIUM 40 MG/0.4ML ~~LOC~~ SOLN
40.0000 mg | SUBCUTANEOUS | Status: DC
  Administered 2018-12-21: 22:00:00 40 mg via SUBCUTANEOUS
  Filled 2018-12-21: qty 0.4

## 2018-12-21 SURGICAL SUPPLY — 42 items
APPLIER CLIP 5 13 M/L LIGAMAX5 (MISCELLANEOUS) ×3
BLADE SURG 15 STRL LF DISP TIS (BLADE) ×1 IMPLANT
BLADE SURG 15 STRL SS (BLADE) ×2
CANISTER SUCT 1200ML W/VALVE (MISCELLANEOUS) ×3 IMPLANT
CATH CHOLANGI 4FR 420404F (CATHETERS) IMPLANT
CHLORAPREP W/TINT 26 (MISCELLANEOUS) ×3 IMPLANT
CLIP APPLIE 5 13 M/L LIGAMAX5 (MISCELLANEOUS) ×1 IMPLANT
CONRAY 60ML FOR OR (MISCELLANEOUS) ×3 IMPLANT
COVER WAND RF STERILE (DRAPES) ×3 IMPLANT
DERMABOND ADVANCED (GAUZE/BANDAGES/DRESSINGS) ×2
DERMABOND ADVANCED .7 DNX12 (GAUZE/BANDAGES/DRESSINGS) ×1 IMPLANT
DRAPE C-ARM XRAY 36X54 (DRAPES) IMPLANT
ELECT REM PT RETURN 9FT ADLT (ELECTROSURGICAL) ×3
ELECTRODE REM PT RTRN 9FT ADLT (ELECTROSURGICAL) ×1 IMPLANT
GLOVE SURG SYN 7.0 (GLOVE) ×3 IMPLANT
GLOVE SURG SYN 7.5  E (GLOVE) ×2
GLOVE SURG SYN 7.5 E (GLOVE) ×1 IMPLANT
GOWN STRL REUS W/ TWL LRG LVL3 (GOWN DISPOSABLE) ×3 IMPLANT
GOWN STRL REUS W/TWL LRG LVL3 (GOWN DISPOSABLE) ×6
IRRIGATION STRYKERFLOW (MISCELLANEOUS) IMPLANT
IRRIGATOR STRYKERFLOW (MISCELLANEOUS)
IV CATH ANGIO 12GX3 LT BLUE (NEEDLE) ×3 IMPLANT
IV NS 1000ML (IV SOLUTION)
IV NS 1000ML BAXH (IV SOLUTION) IMPLANT
JACKSON PRATT 10 (INSTRUMENTS) IMPLANT
L-HOOK LAP DISP 36CM (ELECTROSURGICAL) ×3
LABEL OR SOLS (LABEL) ×3 IMPLANT
LHOOK LAP DISP 36CM (ELECTROSURGICAL) ×1 IMPLANT
NEEDLE HYPO 22GX1.5 SAFETY (NEEDLE) ×6 IMPLANT
PACK LAP CHOLECYSTECTOMY (MISCELLANEOUS) ×3 IMPLANT
PENCIL ELECTRO HAND CTR (MISCELLANEOUS) ×3 IMPLANT
POUCH SPECIMEN RETRIEVAL 10MM (ENDOMECHANICALS) ×3 IMPLANT
SCISSORS METZENBAUM CVD 33 (INSTRUMENTS) ×3 IMPLANT
SET TUBE SMOKE EVAC HIGH FLOW (TUBING) ×3 IMPLANT
SLEEVE ADV FIXATION 5X100MM (TROCAR) ×9 IMPLANT
SPONGE VERSALON 4X4 4PLY (MISCELLANEOUS) IMPLANT
SUT MNCRL 4-0 (SUTURE) ×2
SUT MNCRL 4-0 27XMFL (SUTURE) ×1
SUT VICRYL 0 AB UR-6 (SUTURE) ×3 IMPLANT
SUTURE MNCRL 4-0 27XMF (SUTURE) ×1 IMPLANT
TROCAR BALLN GELPORT 12X130M (ENDOMECHANICALS) ×3 IMPLANT
TROCAR Z-THREAD OPTICAL 5X100M (TROCAR) ×3 IMPLANT

## 2018-12-21 NOTE — Progress Notes (Signed)
[facility] Inpatient Progress Note  Subjective: Patient seen for follow-up of choledocholithiasis status post ERCP with increased serum lipase.  Patient denies any significant abdominal pain, nausea or vomiting.  Patient was given a presumptive diagnosis of post ERCP pancreatitis with elevated lipase which can occur even an uncomplicated cases after an ERCP.  Placed on clears this morning and apparently tolerating them well. Patient states her pain is overall better since the ERCP, mentioning she can lay on her right side comfortably now.  Objective: Vital signs in last 24 hours: Temp:  [98.1 F (36.7 C)-99.7 F (37.6 C)] 98.1 F (36.7 C) (05/08 0800) Pulse Rate:  [65-86] 74 (05/08 0800) Resp:  [16-20] 16 (05/08 0800) BP: (129-139)/(87-90) 139/89 (05/08 0800) SpO2:  [96 %-98 %] 98 % (05/08 0800) Weight:  [86 kg] 86 kg (05/08 0406) Blood pressure 139/89, pulse 74, temperature 98.1 F (36.7 C), temperature source Oral, resp. rate 16, height 5\' 8"  (1.727 m), weight 86 kg, SpO2 98 %.    Intake/Output from previous day: 05/07 0701 - 05/08 0700 In: 3339.3 [P.O.:480; I.V.:2859.3] Out: -   Intake/Output this shift: No intake/output data recorded.   General appearance: Alert, no distress Resp: Clear to auscultation Cardio: The rate, no gallop GI: Bowel sounds positive, minimal tenderness without rebound or guarding.  No masses Extremities: No edema or atrophy.   Lab Results: Results for orders placed or performed during the hospital encounter of 12/19/18 (from the past 24 hour(s))  Comprehensive metabolic panel     Status: Abnormal   Collection Time: 12/21/18  2:55 AM  Result Value Ref Range   Sodium 140 135 - 145 mmol/L   Potassium 3.2 (L) 3.5 - 5.1 mmol/L   Chloride 106 98 - 111 mmol/L   CO2 26 22 - 32 mmol/L   Glucose, Bld 91 70 - 99 mg/dL   BUN <5 (L) 6 - 20 mg/dL   Creatinine, Ser 6.07 0.44 - 1.00 mg/dL   Calcium 8.6 (L) 8.9 - 10.3 mg/dL   Total  Protein 6.5 6.5 - 8.1 g/dL   Albumin 3.3 (L) 3.5 - 5.0 g/dL   AST 24 15 - 41 U/L   ALT 91 (H) 0 - 44 U/L   Alkaline Phosphatase 103 38 - 126 U/L   Total Bilirubin 1.9 (H) 0.3 - 1.2 mg/dL   GFR calc non Af Amer >60 >60 mL/min   GFR calc Af Amer >60 >60 mL/min   Anion gap 8 5 - 15  Lipase, blood     Status: Abnormal   Collection Time: 12/21/18  2:55 AM  Result Value Ref Range   Lipase 243 (H) 11 - 51 U/L     Recent Labs    12/19/18 0018  WBC 6.9  HGB 12.6  HCT 39.7  PLT 220   BMET Recent Labs    12/19/18 0018 12/20/18 0254 12/21/18 0255  NA 143 138 140  K 3.6 3.1* 3.2*  CL 107 106 106  CO2 27 24 26   GLUCOSE 105* 108* 91  BUN 7 <5* <5*  CREATININE 0.77 0.72 0.80  CALCIUM 9.3 8.4* 8.6*   LFT Recent Labs    12/19/18 0018  12/21/18 0255  PROT 7.6   < > 6.5  ALBUMIN 4.0   < > 3.3*  AST 184*   < > 24  ALT 184*   < > 91*  ALKPHOS 123   < > 103  BILITOT 3.4*   < > 1.9*  BILIDIR 1.5*  --   --  IBILI 1.9*  --   --    < > = values in this interval not displayed.   PT/INR No results for input(s): LABPROT, INR in the last 72 hours. Hepatitis Panel No results for input(s): HEPBSAG, HCVAB, HEPAIGM, HEPBIGM in the last 72 hours. C-Diff No results for input(s): CDIFFTOX in the last 72 hours. No results for input(s): CDIFFPCR in the last 72 hours.   Studies/Results: No results found.  Scheduled Inpatient Medications:   . docusate sodium  100 mg Oral BID  . heparin  5,000 Units Subcutaneous Q8H  . hydrochlorothiazide  25 mg Oral Daily  . indomethacin  100 mg Rectal Once  . potassium chloride  40 mEq Oral Once  . sodium chloride flush  3 mL Intravenous Once    Continuous Inpatient Infusions:   . sodium chloride 150 mL/hr at 12/21/18 0809    PRN Inpatient Medications:  acetaminophen **OR** acetaminophen, labetalol, morphine injection, ondansetron **OR** ondansetron (ZOFRAN) IV  Miscellaneous: N/A  Assessment:  1.  Presumptive diagnosis of post ERCP  pancreatitis- clinically there is no pancreatitis, lipase has decreased, no wbc available unfortunately.  2. Cholelithiasis with choledocholithiasis - Latter resolved.post ERCP.  3. Abdominal pain.  Plan:   1. Check CBC w/ diff. Trending lipase doesn't help guide management of presumed pancreatitis. 2. Continue clears as surgery was canceled for today. 3. I recommend cholecystectomy if wbc is normal. 4. Following.  Dr. Servando SnareWohl, who performed the ERCP, is covering 5/9 and 5/10.  Bob Daversa K. Norma Fredricksonoledo, M.D. 12/21/2018, 1:48 PM

## 2018-12-21 NOTE — Progress Notes (Signed)
[employer] - [place] at [facility]     PATIENT NAME: Tina Cummings    MR#:  [id number]  DATE OF BIRTH:  05/01/1981  SUBJECTIVE:   Still having some epigastric abdominal pain.  Lipase is trended down.  No nausea or vomiting.  Discussed with surgery and they have postpone surgery for now given post-ERCP pancreatitis.   REVIEW OF SYSTEMS:    Review of Systems  Constitutional: Negative for chills and fever.  HENT: Negative for congestion and tinnitus.   Eyes: Negative for blurred vision and double vision.  Respiratory: Negative for cough, shortness of breath and wheezing.   Cardiovascular: Negative for chest pain, orthopnea and PND.  Gastrointestinal: Positive for abdominal pain. Negative for diarrhea, nausea and vomiting.  Genitourinary: Negative for dysuria and hematuria.  Neurological: Negative for dizziness, sensory change and focal weakness.  All other systems reviewed and are negative.   Nutrition: Clear liquids Tolerating Diet: Yes Tolerating PT: Ambulatory     DRUG ALLERGIES:  No Known Allergies  VITALS:  Blood pressure 139/89, pulse 74, temperature 98.1 F (36.7 C), temperature source Oral, resp. rate 16, height 5\' 8"  (1.727 m), weight 86 kg, SpO2 98 %.  PHYSICAL EXAMINATION:   Physical Exam  GENERAL:  38 y.o.-year-old patient lying in bed in no acute distress.  EYES: Pupils equal, round, reactive to light and accommodation. No scleral icterus. Extraocular muscles intact.  HEENT: Head atraumatic, normocephalic. Oropharynx and nasopharynx clear.  NECK:  Supple, no jugular venous distention. No thyroid enlargement, no tenderness.  LUNGS: Normal breath sounds bilaterally, no wheezing, rales, rhonchi. No use of accessory muscles of respiration.  CARDIOVASCULAR: S1, S2 normal. No murmurs, rubs, or gallops.  ABDOMEN: Soft, tender in epigastric area, nondistended. Bowel sounds present. No organomegaly or mass.  EXTREMITIES: No cyanosis, clubbing or  edema b/l.    NEUROLOGIC: Cranial nerves II through XII are intact. No focal Motor or sensory deficits b/l.   PSYCHIATRIC: The patient is alert and oriented x 3.  SKIN: No obvious rash, lesion, or ulcer.    LABORATORY PANEL:   CBC Recent Labs  Lab 12/19/18 0018  WBC 6.9  HGB 12.6  HCT 39.7  PLT 220   ------------------------------------------------------------------------------------------------------------------  Chemistries  Recent Labs  Lab 12/21/18 0255  NA 140  K 3.2*  CL 106  CO2 26  GLUCOSE 91  BUN <5*  CREATININE 0.80  CALCIUM 8.6*  AST 24  ALT 91*  ALKPHOS 103  BILITOT 1.9*   ------------------------------------------------------------------------------------------------------------------  Cardiac Enzymes Recent Labs  Lab 12/19/18 0018  TROPONINI <0.03   ------------------------------------------------------------------------------------------------------------------  RADIOLOGY:  No results found.   ASSESSMENT AND PLAN:   38 year old female with past medical history of hypertension who presented to the hospital due to abdominal pain and noted to have choledocholithiasis with findings suggestive of acute cholecystitis.  1.  Choledocholithiasis/acute cholecystitis- Seen by gastroenterology and status post ERCP today with sphincterotomy and extraction of 2 stones. -Afebrile, white cell count is stable.  -Seen by surgery and plan on doing lap cholecystectomy in the near future as patient has post ERCP pancreatitis and is clinically symptomatic.  2.  Post ERCP pancreatitis- lipase still mildly elevated.  Patient still having some epigastric pain.  No other acute nausea or vomiting. -Follow lipase, will place on clear liquid diet and advance as tolerated.  3.  Abnormal LFTs- secondary to as mentioned above. -LFTs are improving and will cont. To monitor.   4.  Essential hypertension-continue hydrochlorothiazide.  5.  Hypokalemia- improved  w/  supplementation.   - will repeat in a.m. Check Mag.   Discussed plan of care with Gen. Surgery Dr. Aleen CampiPiscoya.   All the records are reviewed and case discussed with Care Management/Social Worker. Management plans discussed with the patient, family and they are in agreement.  CODE STATUS: Full code  DVT Prophylaxis: Hep SQ  TOTAL TIME TAKING CARE OF THIS PATIENT: 30 minutes.   POSSIBLE D/C IN 1-2 DAYS, DEPENDING ON CLINICAL CONDITION.   [place] SirenVivek J Izick Gasbarro M.D on 12/21/2018 at 12:30 PM  Between 7am to 6pm - Pager - [phone]  After 6pm go to [email or web address] - Social research officer, governmentpassword EPAS [facility]  [facility] [place] [facility]  Office  [phone]  CC: Primary care physician; Jenell MillinerJohnson, Sally R, MD

## 2018-12-21 NOTE — Plan of Care (Signed)

## 2018-12-21 NOTE — Progress Notes (Signed)
12/21/2018  Subjective: No acute events overnight.  This morning she is still having some pain in the epigastric area although has improved.  Lipase is improved as well.  Vital signs: Temp:  [98.1 F (36.7 C)-99.7 F (37.6 C)] 98.1 F (36.7 C) (05/08 0800) Pulse Rate:  [65-86] 74 (05/08 0800) Resp:  [16-20] 16 (05/08 0800) BP: (129-139)/(87-90) 139/89 (05/08 0800) SpO2:  [96 %-98 %] 98 % (05/08 0800) Weight:  [86 kg] 86 kg (05/08 0406)   Intake/Output: 05/07 0701 - 05/08 0700 In: 3339.3 [P.O.:480; I.V.:2859.3] Out: -  Last BM Date: 12/20/18  Physical Exam: Constitutional: No acute distress Abdomen: Soft, nondistended, with tenderness to palpation epigastric area.  Non-peritoneal.  No tenderness in the right upper quadrant.  Labs:  Recent Labs    12/19/18 0018  WBC 6.9  HGB 12.6  HCT 39.7  PLT 220   Recent Labs    12/20/18 0254 12/21/18 0255  NA 138 140  K 3.1* 3.2*  CL 106 106  CO2 24 26  GLUCOSE 108* 91  BUN <5* <5*  CREATININE 0.72 0.80  CALCIUM 8.4* 8.6*   No results for input(s): LABPROT, INR in the last 72 hours.  Imaging: No results found.  Assessment/Plan: This is a 38 y.o. female with cholecystitis and choledocholithiasis, status post ERCP, now complicated with ERCP pancreatitis.  - Given her persistent pain today although improved, we will not do any surgery today.  Now at this point the inflammation is likely setting and more and discussed with the patient postponing this for 6 weeks so that we can do the surgery more safely.  There is also some initial concern from the anesthesia team given her pancreatitis.  Patient understands although does appear disappointed which I completely understand but at this point a rather play this safely so that we can have a better surgery and a better outcome in the future rather than risk any potential complications from an elective surgery given any inflammation from the pancreas. -Today we will start with only sips  of water and ice chips and may start advancing to clear liquid diet later today if she is doing well.   Howie Ill, MD [facility]

## 2018-12-22 LAB — COMPREHENSIVE METABOLIC PANEL
ALT: 57 U/L — ABNORMAL HIGH (ref 0–44)
AST: 14 U/L — ABNORMAL LOW (ref 15–41)
Albumin: 3.1 g/dL — ABNORMAL LOW (ref 3.5–5.0)
Alkaline Phosphatase: 89 U/L (ref 38–126)
Anion gap: 7 (ref 5–15)
BUN: <5 mg/dL — ABNORMAL LOW (ref 6–20)
CO2: 24 mmol/L (ref 22–32)
Calcium: 8.6 mg/dL — ABNORMAL LOW (ref 8.9–10.3)
Chloride: 109 mmol/L (ref 98–111)
Creatinine, Ser: 0.61 mg/dL (ref 0.44–1.00)
GFR calc Af Amer: >60 mL/min (ref >60)
GFR calc non Af Amer: >60 mL/min (ref >60)
Glucose, Bld: 99 mg/dL (ref 70–99)
Potassium: 3.5 mmol/L (ref 3.5–5.1)
Sodium: 140 mmol/L (ref 135–145)
Total Bilirubin: 1.3 mg/dL — ABNORMAL HIGH (ref 0.3–1.2)
Total Protein: 6.2 g/dL — ABNORMAL LOW (ref 6.5–8.1)

## 2018-12-22 LAB — CBC
HCT: 32.2 % — ABNORMAL LOW (ref 36.0–46.0)
Hemoglobin: 10.3 g/dL — ABNORMAL LOW (ref 12.0–15.0)
MCH: 27.5 pg (ref 26.0–34.0)
MCHC: 32 g/dL (ref 30.0–36.0)
MCV: 86.1 fL (ref 80.0–100.0)
Platelets: 165 10*3/uL (ref 150–400)
RBC: 3.74 MIL/uL — ABNORMAL LOW (ref 3.87–5.11)
RDW: 13.3 % (ref 11.5–15.5)
WBC: 8.5 10*3/uL (ref 4.0–10.5)
nRBC: 0 % (ref 0.0–0.2)

## 2018-12-22 LAB — LIPASE, BLOOD: Lipase: 92 U/L — ABNORMAL HIGH (ref 11–51)

## 2018-12-22 NOTE — Progress Notes (Signed)
This patient had an ERCP a few days ago with a bump in her lipase that never went over thousand.  The patient's abdominal pain has been reported to be consistent with her preprocedure pain.  The lipase continue to rapidly decrease with her lipase being 92 today.  Her liver enzymes have also come down.  The patient was scheduled for a laparoscopic cholecystectomy which was canceled due to the lipase of 243 yesterday with the patient's report of abdominal pain. There is nothing further to do from a GI point of view and the patient has recovered well from her ERCP.  I will sign off.  Please call if any further GI concerns or questions.  We would like to thank you for the opportunity to participate in the care of [facility].

## 2018-12-22 NOTE — Progress Notes (Signed)
Subjective:  CC: Tina Cummings is a 38 y.o. female  Hospital stay day 3, 3 Days Post-Op ERCP  HPI: No issues overnight, tolerating clears.  Denies pain  ROS:  General: Denies weight loss, weight gain, fatigue, fevers, chills, and night sweats. Heart: Denies chest pain, palpitations, racing heart, irregular heartbeat, leg pain or swelling, and decreased activity tolerance. Respiratory: Denies breathing difficulty, shortness of breath, wheezing, cough, and sputum. GI: Denies change in appetite, heartburn, nausea, vomiting, constipation, diarrhea, and blood in stool. GU: Denies difficulty urinating, pain with urinating, urgency, frequency, blood in urine.   Objective:   Temp:  [98.3 F (36.8 C)-99.8 F (37.7 C)] 98.3 F (36.8 C) (05/09 0401) Pulse Rate:  [75-88] 75 (05/09 0401) Resp:  [16-18] 18 (05/09 0401) BP: (112-129)/(71-91) 125/71 (05/09 0401) SpO2:  [97 %-100 %] 97 % (05/09 0401) Weight:  [86 kg] 86 kg (05/09 0535)     Height: 5\' 8"  (172.7 cm) Weight: 86 kg BMI (Calculated): 28.82   Intake/Output this shift:   Intake/Output Summary (Last 24 hours) at 12/22/2018 1343 Last data filed at 12/22/2018 1313 Gross per 24 hour  Intake 240 ml  Output -  Net 240 ml    Constitutional :  alert, cooperative, appears stated age and no distress  Respiratory:  clear to auscultation bilaterally  Cardiovascular:  regular rate and rhythm  Gastrointestinal: soft, non-tender; bowel sounds normal; no masses,  no organomegaly.   Skin: Cool and moist.   Psychiatric: Normal affect, non-agitated, not confused       LABS:  CMP Latest Ref Rng & Units 12/22/2018 12/21/2018 12/20/2018  Glucose 70 - 99 mg/dL 99 91 696(E)  BUN 6 - 20 mg/dL <9(B) <2(W) <4(X)  Creatinine 0.44 - 1.00 mg/dL 3.24 4.01 0.27  Sodium 135 - 145 mmol/L 140 140 138  Potassium 3.5 - 5.1 mmol/L 3.5 3.2(L) 3.1(L)  Chloride 98 - 111 mmol/L 109 106 106  CO2 22 - 32 mmol/L 24 26 24   Calcium 8.9 - 10.3 mg/dL 2.5(D) 6.6(Y) 4.0(H)  Total  Protein 6.5 - 8.1 g/dL 6.2(L) 6.5 6.4(L)  Total Bilirubin 0.3 - 1.2 mg/dL 4.7(Q) 1.9(H) 1.9(H)  Alkaline Phos 38 - 126 U/L 89 103 107  AST 15 - 41 U/L 14(L) 24 75(H)  ALT 0 - 44 U/L 57(H) 91(H) 154(H)   CBC Latest Ref Rng & Units 12/22/2018 12/21/2018 12/19/2018  WBC 4.0 - 10.5 K/uL 8.5 8.9 6.9  Hemoglobin 12.0 - 15.0 g/dL 10.3(L) 11.1(L) 12.6  Hematocrit 36.0 - 46.0 % 32.2(L) 34.2(L) 39.7  Platelets 150 - 400 K/uL 165 160 220    RADS: n/a Assessment:   S/p ERCP for elevated LFTs, labs now downtrending, no pain, tolerating clears.  Ok to be d/c'd once tolerating regular diet.  F/u with [facility] for further discussion about interval lap chole.  No need for outpt abx since she only required one dose in ED, with resolution of symptoms afterwards.

## 2018-12-22 NOTE — Discharge Summary (Signed)
[facility] [place] at [facility]   PATIENT NAME: Tina Cummings    MR#:  [id number]  DATE OF BIRTH:  [birth year]  DATE OF ADMISSION:  12/19/2018 ADMITTING PHYSICIAN: Arnaldo Natal, MD  DATE OF DISCHARGE: No discharge date for patient encounter.  PRIMARY CARE PHYSICIAN: Jenell Milliner, MD    ADMISSION DIAGNOSIS:  Acute cholecystitis [K81.0] Choledocholithiasis [K80.50] Elevated LFTs [R94.5] Elevated bilirubin [R17]  DISCHARGE DIAGNOSIS:  Active Problems:   Acute cholecystitis   Choledocholithiasis   SECONDARY DIAGNOSIS:   Past Medical History:  Diagnosis Date  . HTN (hypertension)     HOSPITAL COURSE:  38 year old female with past medical history of hypertension who presented to the hospital due to abdominal pain and noted to have choledocholithiasis with findings suggestive of acute cholecystitis.  *Acute Choledocholithiasis/acute cholecystitis Resolved Seen by gastroenterology/Dr. Norma Fredrickson -s/p ERCP with sphincterotomy and extraction of 2 stones, seen by surgery and plan on doing lap cholecystectomy as outpt, tolerating clear liquids, on day of discharge patient is afebrile, hemodynamically stable, no pain, tolerating a diet, ready for discharge with outpatient follow-up   *Acute ERCP pancreatitis Resolved Plan of care as stated above  *Essential hypertension continue hydrochlorothiazide  *Hypokalemia Repleted  DISCHARGE CONDITIONS:   stable  CONSULTS OBTAINED:  Treatment Team:  Midge Minium, MD Ancil Linsey, MD Stanton Kidney, MD  DRUG ALLERGIES:  No Known Allergies  DISCHARGE MEDICATIONS:   Allergies as of 12/22/2018   No Known Allergies     Medication List    TAKE these medications   hydrochlorothiazide 25 MG tablet Commonly known as:  HYDRODIURIL Take 25 mg by mouth daily.   medroxyPROGESTERone 150 MG/ML injection Commonly known as:  DEPO-PROVERA Inject 150 mg into the muscle every 3 (three) months.         DISCHARGE INSTRUCTIONS:  If you experience worsening of your admission symptoms, develop shortness of breath, life threatening emergency, suicidal or homicidal thoughts you must seek medical attention immediately by calling 911 or calling your MD immediately  if symptoms less severe.  You Must read complete instructions/literature along with all the possible adverse reactions/side effects for all the Medicines you take and that have been prescribed to you. Take any new Medicines after you have completely understood and accept all the possible adverse reactions/side effects.   Please note  You were cared for by a hospitalist during your hospital stay. If you have any questions about your discharge medications or the care you received while you were in the hospital after you are discharged, you can call the unit and asked to speak with the hospitalist on call if the hospitalist that took care of you is not available. Once you are discharged, your primary care physician will handle any further medical issues. Please note that NO REFILLS for any discharge medications will be authorized once you are discharged, as it is imperative that you return to your primary care physician (or establish a relationship with a primary care physician if you do not have one) for your aftercare needs so that they can reassess your need for medications and monitor your lab values.    Today   CHIEF COMPLAINT:   Chief Complaint  Patient presents with  . Abdominal Pain    HISTORY OF PRESENT ILLNESS:  The patient with past medical history of hypertension presents to the emergency department complaining of abdominal pain.  The patient reports worsening right upper quadrant pain x3 days.  She denies nausea, vomiting or  diarrhea.  She also denies fever.  She has had episodes of pain similar to this for the last 3 weeks.  Laboratory evaluation in the emergency department revealed elevated liver enzymes. Ultrasound  showed bilateral stones as well as choledocholithiasis.  She was given a dose of Zosyn in the emergency department prior to the hospitalist service being called for admission.  VITAL SIGNS:  Blood pressure 125/71, pulse 75, temperature 98.3 F (36.8 C), temperature source Oral, resp. rate 18, height  (1.727 m), weight 86 kg, SpO2 97 %.  I/O:  No intake or output data in the 24 hours ending 12/22/18 1022  PHYSICAL EXAMINATION:  GENERAL:  38 y.o.-year-old patient lying in the bed with no acute distress.  EYES: Pupils equal, round, reactive to light and accommodation. No scleral icterus. Extraocular muscles intact.  HEENT: Head atraumatic, normocephalic. Oropharynx and nasopharynx clear.  NECK:  Supple, no jugular venous distention. No thyroid enlargement, no tenderness.  LUNGS: Normal breath sounds bilaterally, no wheezing, rales,rhonchi or crepitation. No use of accessory muscles of respiration.  CARDIOVASCULAR: S1, S2 normal. No murmurs, rubs, or gallops.  ABDOMEN: Soft, non-tender, non-distended. Bowel sounds present. No organomegaly or mass.  EXTREMITIES: No pedal edema, cyanosis, or clubbing.  NEUROLOGIC: Cranial nerves II through XII are intact. Muscle strength 5/5 in all extremities. Sensation intact. Gait not checked.  PSYCHIATRIC: The patient is alert and oriented x 3.  SKIN: No obvious rash, lesion, or ulcer.   DATA REVIEW:   CBC Recent Labs  Lab 12/22/18 0524  WBC 8.5  HGB 10.3*  HCT 32.2*  PLT 165    Chemistries  Recent Labs  Lab 12/22/18 0524  NA 140  K 3.5  CL 109  CO2 24  GLUCOSE 99  BUN <5*  CREATININE 0.61  CALCIUM 8.6*  AST 14*  ALT 57*  ALKPHOS 89  BILITOT 1.3*    Cardiac Enzymes Recent Labs  Lab 12/19/18 0018  TROPONINI <0.03    Microbiology Results  Results for orders placed or performed during the hospital encounter of 12/19/18  SARS Coronavirus 2 (CEPHEID - Performed in [facility] hospital lab), Hosp Order     Status: None    Collection Time: 12/19/18  5:10 AM  Result Value Ref Range Status   SARS Coronavirus 2 NEGATIVE NEGATIVE Final    Comment: (NOTE) If result is NEGATIVE SARS-CoV-2 target nucleic acids are NOT DETECTED. The SARS-CoV-2 RNA is generally detectable in upper and lower  respiratory specimens during the acute phase of infection. The lowest  concentration of SARS-CoV-2 viral copies this assay can detect is 250  copies / mL. A negative result does not preclude SARS-CoV-2 infection  and should not be used as the sole basis for treatment or other  patient management decisions.  A negative result may occur with  improper specimen collection / handling, submission of specimen other  than nasopharyngeal swab, presence of viral mutation(s) within the  areas targeted by this assay, and inadequate number of viral copies  (<250 copies / mL). A negative result must be combined with clinical  observations, patient history, and epidemiological information. If result is POSITIVE SARS-CoV-2 target nucleic acids are DETECTED. The SARS-CoV-2 RNA is generally detectable in upper and lower  respiratory specimens dur ing the acute phase of infection.  Positive  results are indicative of active infection with SARS-CoV-2.  Clinical  correlation with patient history and other diagnostic information is  necessary to determine patient infection status.  Positive results do  not  rule out bacterial infection or co-infection with other viruses. If result is PRESUMPTIVE POSTIVE SARS-CoV-2 nucleic acids MAY BE PRESENT.   A presumptive positive result was obtained on the submitted specimen  and confirmed on repeat testing.  While 2019 novel coronavirus  (SARS-CoV-2) nucleic acids may be present in the submitted sample  additional confirmatory testing may be necessary for epidemiological  and / or clinical management purposes  to differentiate between  SARS-CoV-2 and other Sarbecovirus currently known to infect humans.   If clinically indicated additional testing with an alternate test  methodology [phone]([id number]) is advised. The SARS-CoV-2 RNA is generally  detectable in upper and lower respiratory sp ecimens during the acute  phase of infection. The expected result is Negative. Fact Sheet for Patients:  BoilerBrush.com.cyhttps://www.fda.gov/media/136312/download Fact Sheet for Healthcare Providers: https://pope.com/https://www.fda.gov/media/136313/download This test is not yet approved or cleared by the [place] FDA and has been authorized for detection and/or diagnosis of SARS-CoV-2 by FDA under an Emergency Use Authorization (EUA).  This EUA will remain in effect (meaning this test can be used) for the duration of the COVID-19 declaration under Section 564(b)(1) of the Act, 21 U.S.C. section 360bbb-3(b)(1), unless the authorization is terminated or revoked sooner. Performed at [facility], [address] [place], [place] [address]     RADIOLOGY:  No results found.  EKG:   Orders placed or performed during the hospital encounter of 12/19/18  . ED EKG within 10 minutes  . ED EKG within 10 minutes      Management plans discussed with the patient, family and they are in agreement.  CODE STATUS:     Code Status Orders  (From admission, onward)         Start     Ordered   12/19/18 0751  Full code  Continuous     12/19/18 0750        Code Status History    This patient has a current code status but no historical code status.      TOTAL TIME TAKING CARE OF THIS PATIENT: 40 minutes.    Evelena AsaMontell D Roxas Clymer M.D on 12/22/2018 at 10:22 AM  Between 7am to 6pm - Pager - [phone]  After 6pm go to [email or web address] - password [email or web address]  [facility]  Office  [phone]  CC: Primary care physician; Jenell MillinerJohnson, Sally R, MD   Note: This dictation was prepared with Dragon dictation along with smaller phrase technology. Any transcriptional errors that result from this process are unintentional.

## 2018-12-22 NOTE — Progress Notes (Signed)
Pt d/c to home by self. IV removed intact. VSS. Education completed. All questions answered. All belongings sent with pt.

## 2019-01-04 ENCOUNTER — Telehealth: Payer: Self-pay | Admitting: Surgery

## 2019-01-04 NOTE — Telephone Encounter (Signed)
Left a message for the patient to call the office, patient needs a video call for next Friday 01/11/2019

## 2019-01-04 NOTE — Telephone Encounter (Signed)
-----   Message from Henrene Dodge, MD sent at 01/03/2019  9:35 AM EDT ----- Regarding: follow up Hi!    I saw this patient earlier in May in the hospital.  We had to postpone her gallbladder surgery due to pancreatitis.  Can we schedule a virtual video appointment with her for 5/29?  Thanks,  [employer]

## 2019-01-11 ENCOUNTER — Telehealth (INDEPENDENT_AMBULATORY_CARE_PROVIDER_SITE_OTHER): Payer: Medicaid Other | Admitting: Surgery

## 2019-01-11 ENCOUNTER — Other Ambulatory Visit: Payer: Self-pay

## 2019-01-11 DIAGNOSIS — K8042 Calculus of bile duct with acute cholecystitis without obstruction: Secondary | ICD-10-CM

## 2019-01-11 DIAGNOSIS — K859 Acute pancreatitis without necrosis or infection, unspecified: Secondary | ICD-10-CM

## 2019-01-11 DIAGNOSIS — K9189 Other postprocedural complications and disorders of digestive system: Secondary | ICD-10-CM

## 2019-01-13 ENCOUNTER — Encounter: Payer: Self-pay | Admitting: Surgery

## 2019-01-13 NOTE — Progress Notes (Signed)
Virtual Visit via Video Note  I connected with Tina Cummings on 01/13/19 at 12:00 PM EDT by a video enabled telemedicine application and verified that I am speaking with the correct person using two identifiers.  Location: Patient: Home Provider: Office   I discussed the limitations of evaluation and management by telemedicine and the availability of in person appointments. The patient expressed understanding and agreed to proceed.  This service was provided via telemedicine.  The patient consented to the visit being carried via telemedicine.  Patient's location:  Home  Provider's location:  Office  Referring Provider:  None  People participating in this telemedicine visit:  Patient, myself  Time spent:  15 minutes   History of Present Illness: 38 yo female with acute cholecystitis and choledocholithiasis.  She was admitted to hospital on 5/6 and underwent ERCP with Dr Servando Snare.  She had post-ERCP pancreatitis and decision was made to defer surgery for 6 weeks due to symptoms and inflammation.  She reports today that she's doing well.  Denies any further episodes of pain.  Denies any nausea, vomiting.  She is wondering if she actually needs any surgery.   Observations/Objective: Patient does not appear to be in acute distress, and is able to carry conversation without any respiratory issues.  Alert, oriented.  Assessment and Plan: 38 yo female with cholecystitis and choledocholithiasis, complicated by post-ERCP pancreatitis.  --Discussed with patient that the recommendation would be to do cholecystectomy so this episode does not happen in the future.  Unfortunately, all I'm able to tell her is that she is at risk for further episodes, but cannot tell her when or even if she will have an episode.  She is hesitant to do any surgery at the time and will think more about it.  Discussed that she can keep a low fat diet to decrease the risk of gallbladder issues.   She will do this for now and  let us know if she is interested in the future.  Return precautions given to patient.  Follow Up Instructions:    I discussed the assessment and treatment plan with the patient. The patient was provided an opportunity to ask questions and all were answered. The patient agreed with the plan and demonstrated an understanding of the instructions.   The patient was advised to call back or seek an in-person evaluation if the symptoms worsen or if the condition fails to improve as anticipated.  I provided 15 minutes of non-face-to-face time during this encounter.   Henrene Dodge, MD

## 2019-08-13 ENCOUNTER — Ambulatory Visit: Payer: Managed Care, Other (non HMO) | Attending: Internal Medicine

## 2019-08-13 DIAGNOSIS — Z20822 Contact with and (suspected) exposure to covid-19: Secondary | ICD-10-CM

## 2019-08-14 LAB — NOVEL CORONAVIRUS, NAA: SARS-CoV-2, NAA: DETECTED — AB

## 2019-08-17 IMAGING — US ULTRASOUND ABDOMEN LIMITED
1 series · 14 of 25 positions shown · non-contrast
Comparison: None.

CLINICAL DATA: Upper abdominal pain and tenderness for several days

EXAM:
ULTRASOUND ABDOMEN LIMITED RIGHT UPPER QUADRANT

[Series 1: ultrasound abdomen limited · 0.25mm/px · 14 of 63 slices shown]
[im 1/63]
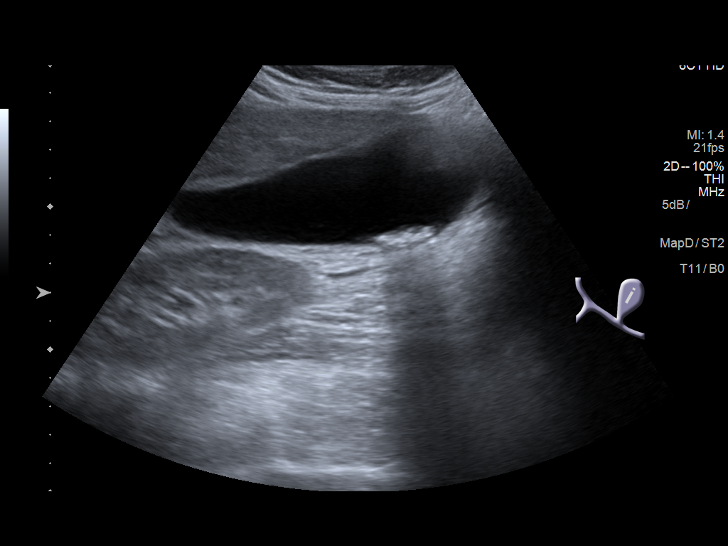
[im 6/63]
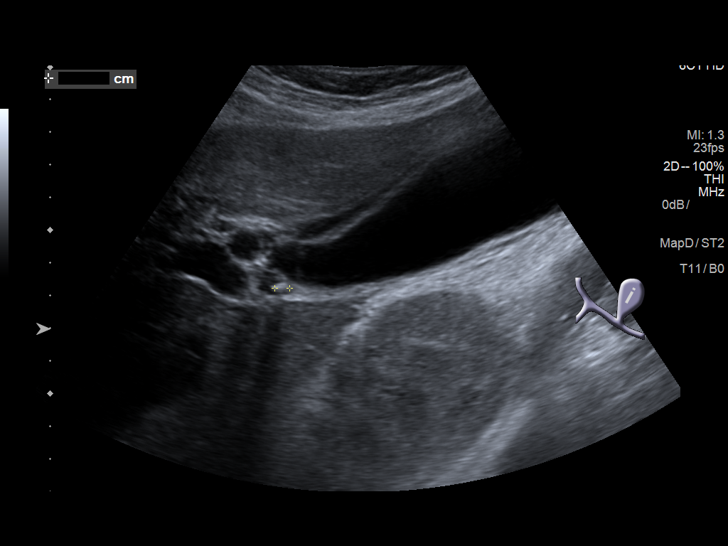
[im 11/63]
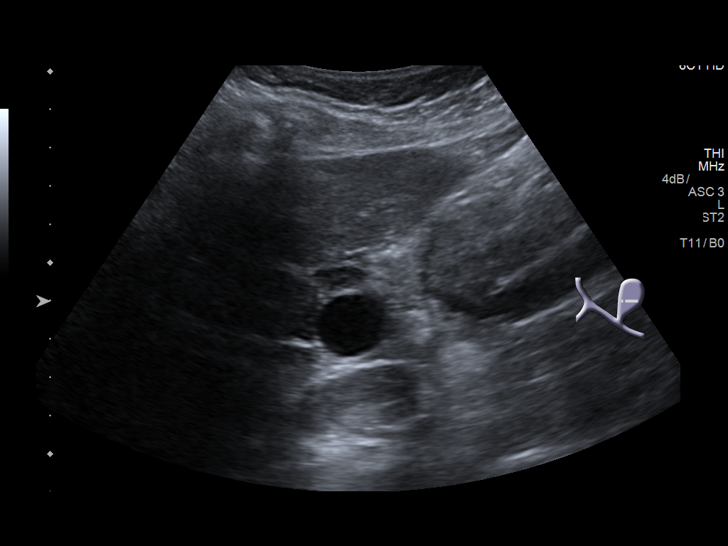
[im 16/63]
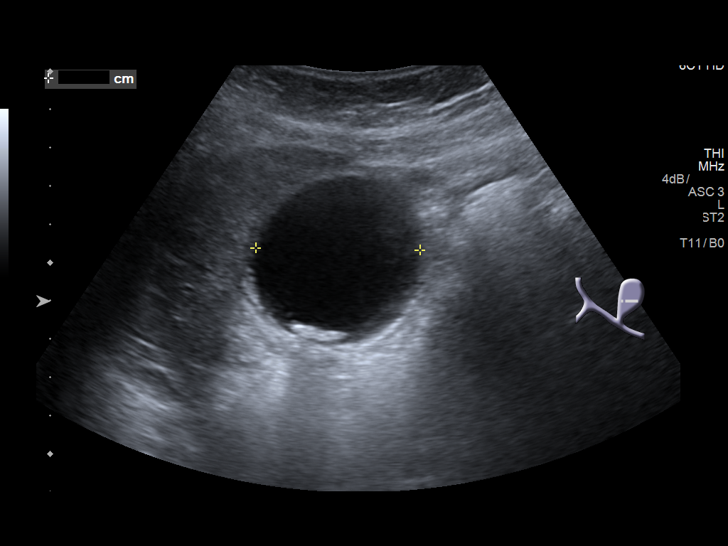
[im 21/63]
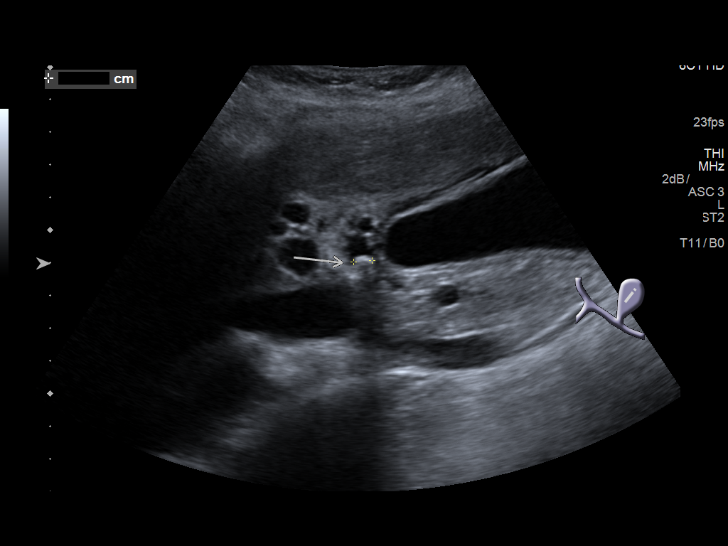
[im 24/63]
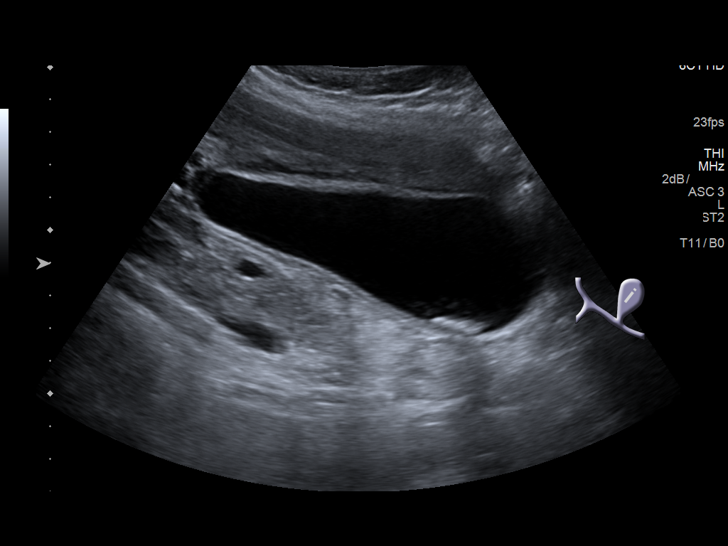
[im 29/63]
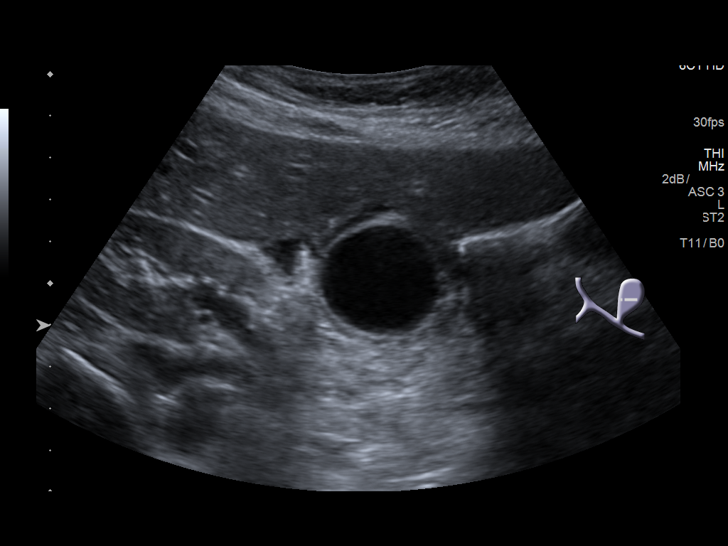
[im 34/63]
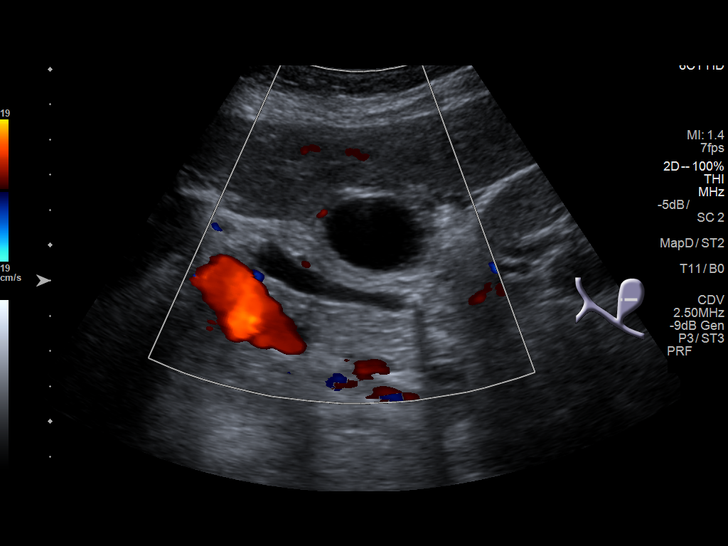
[im 39/63]
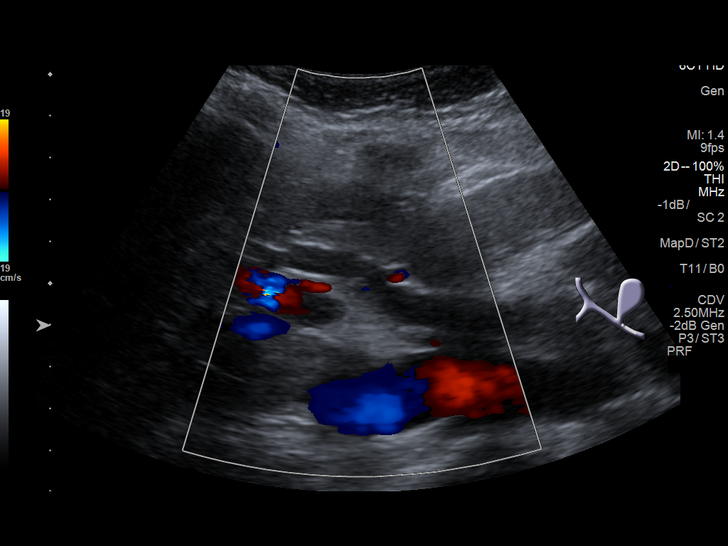
[im 42/63]
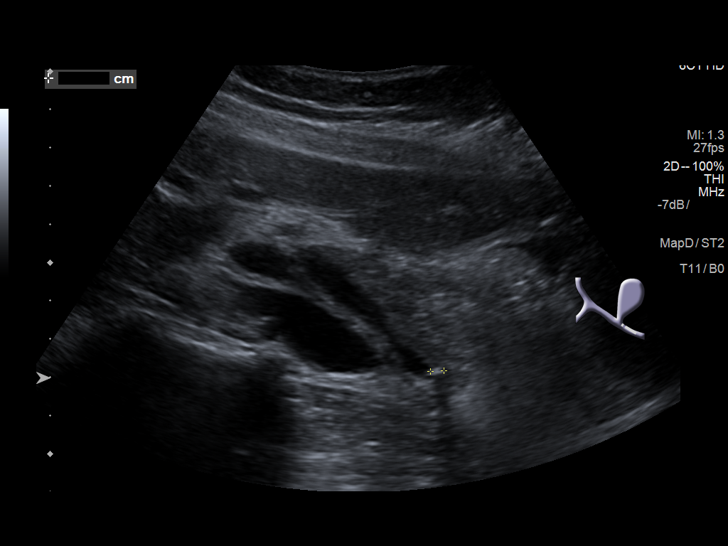
[im 47/63]
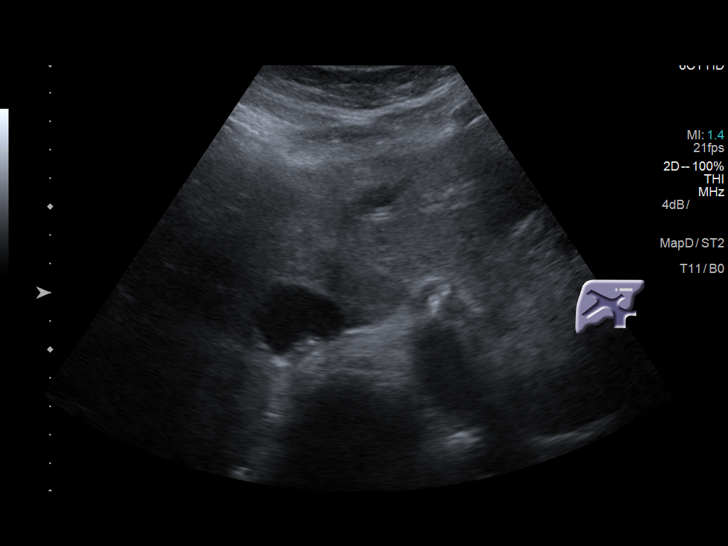
[im 52/63]
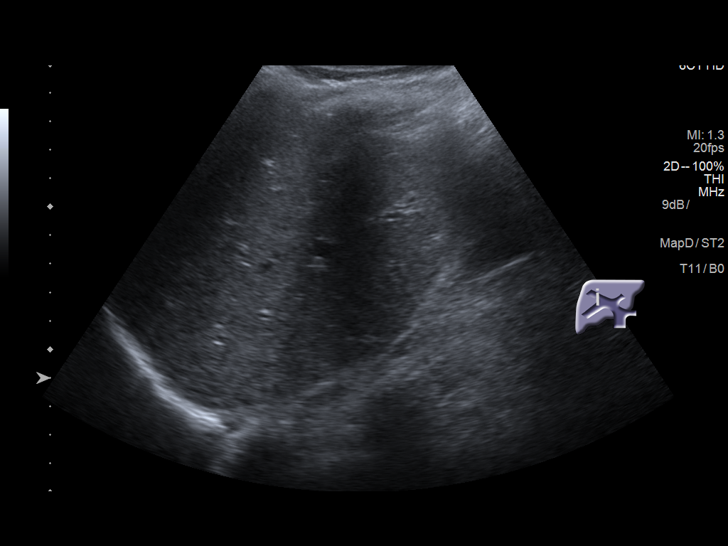
[im 57/63]
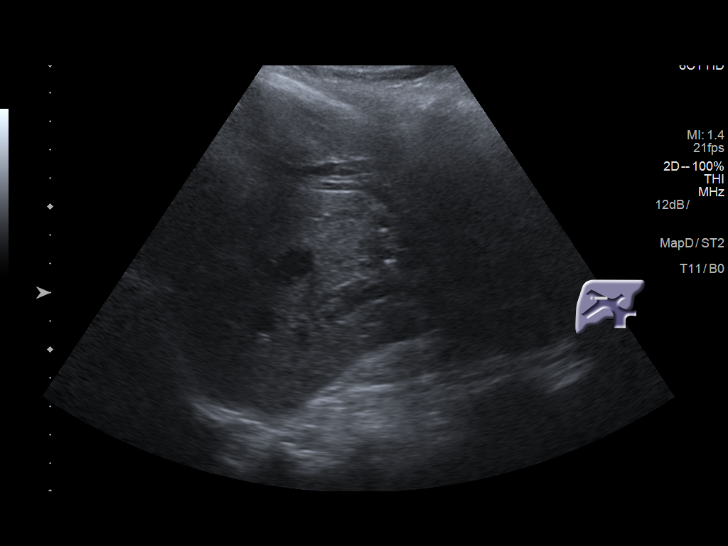
[im 63/63]
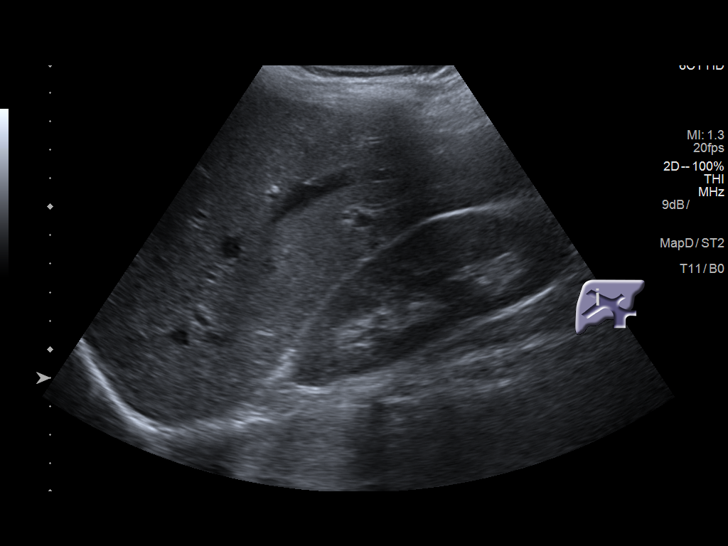

[14 of 25 positions shown; findings below may reference images not displayed]

FINDINGS: Gallbladder:

Gallbladder is distended and contains layering calculi. A stone is
seen at the level of the gallbladder neck that is fixed. Gallbladder
wall is thickened to 6 mm and striated by edema.

Common bile duct:

Diameter: 8 mm. A stone is seen in the distal common bile duct
measuring 6 mm.

Liver:

No focal lesion identified. Within normal limits in parenchymal
echogenicity. Portal vein is patent on color Doppler imaging with
normal direction of blood flow towards the liver.
IMPRESSION: 1. Cholelithiasis. Except for Murphy sign there are findings of
acute cholecystitis.
2. Choledocholithiasis with dilated bile ducts.

## 2019-09-19 IMAGING — CR CHEST - 2 VIEW
1 series · 2 of 2 positions shown · non-contrast
Comparison: None.

CLINICAL DATA: Upper abdominal pain

EXAM:
CHEST - 2 VIEW

[Series 1: dg chest 2 view · 0.14mm/px · 2 of 2 slices shown]
[im 1/2]
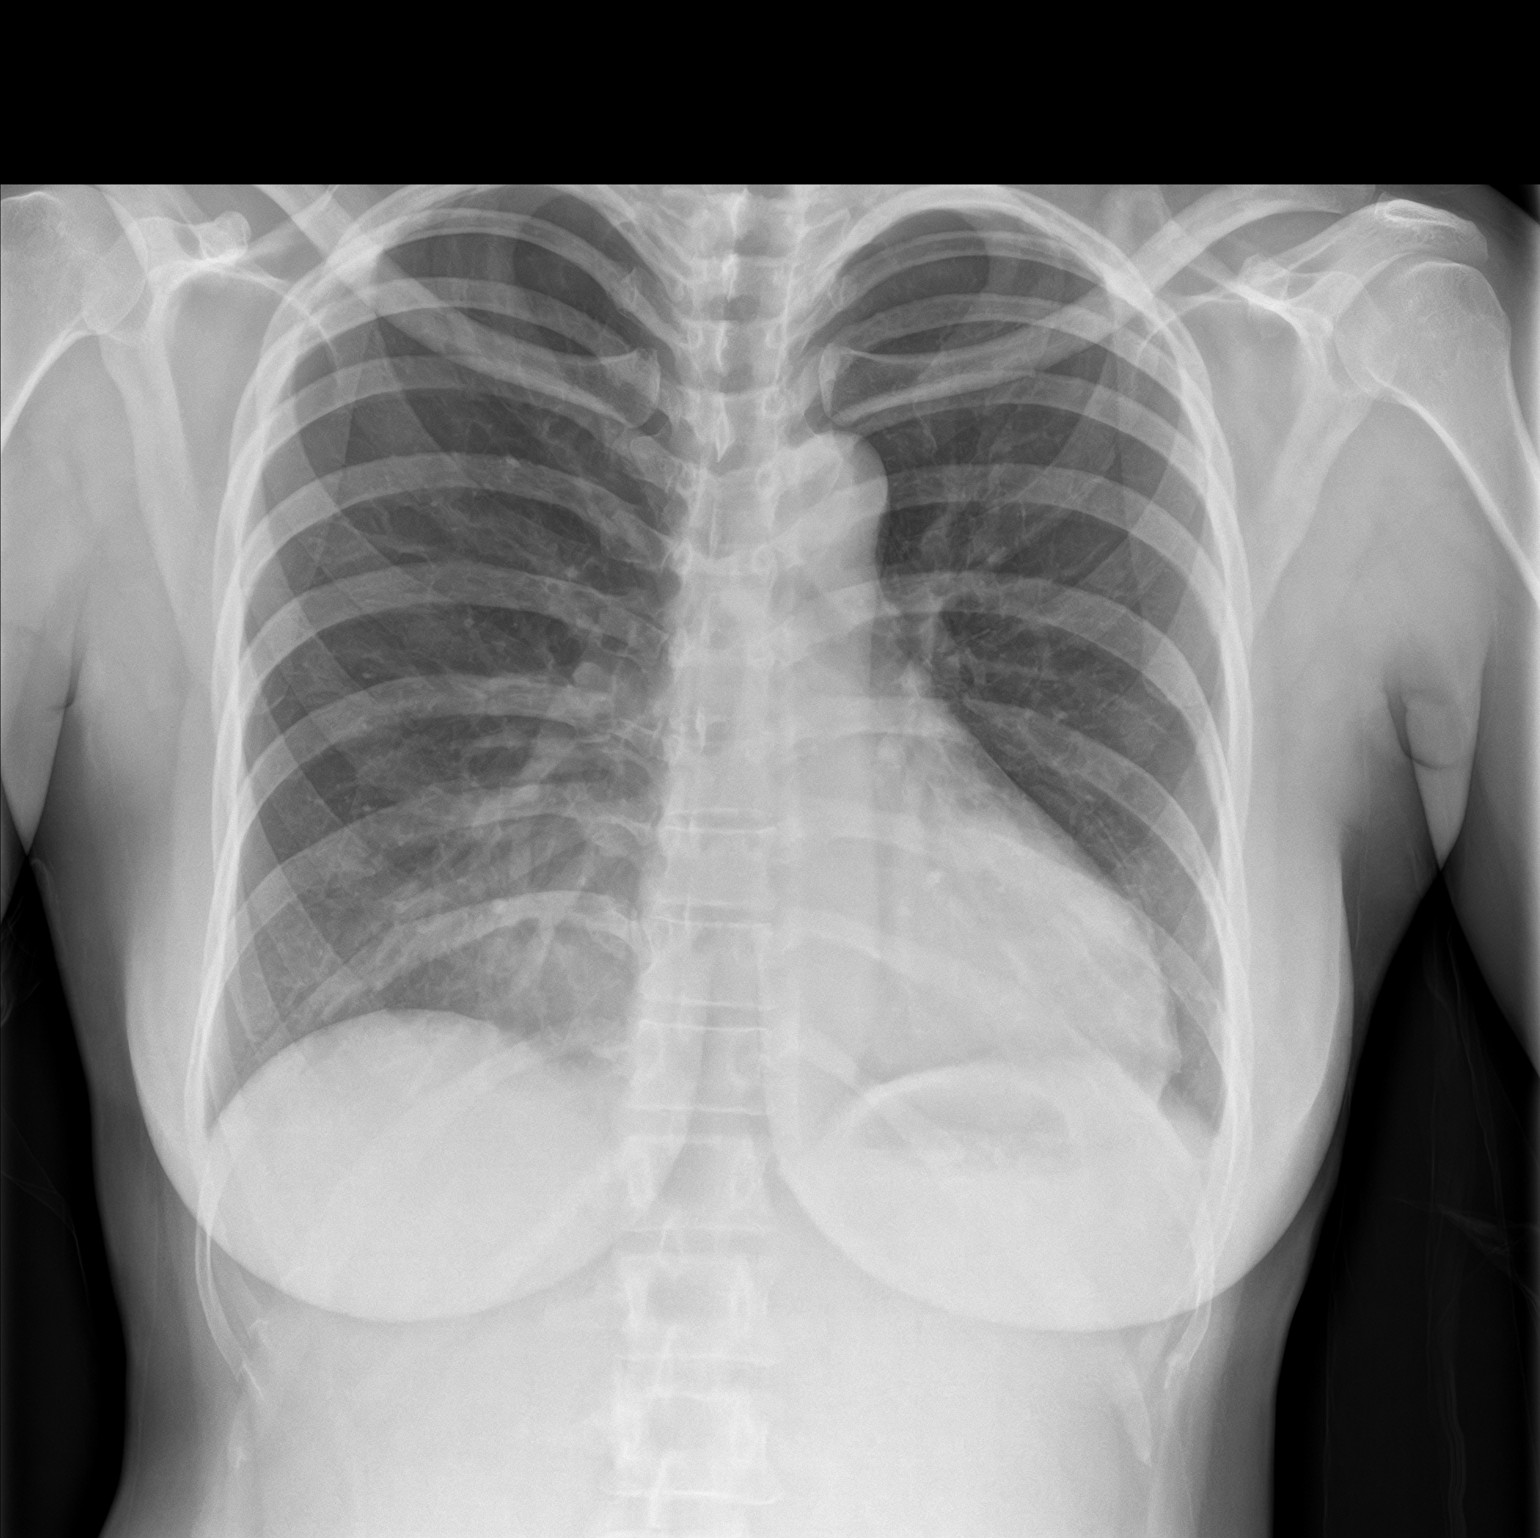
[im 2/2]
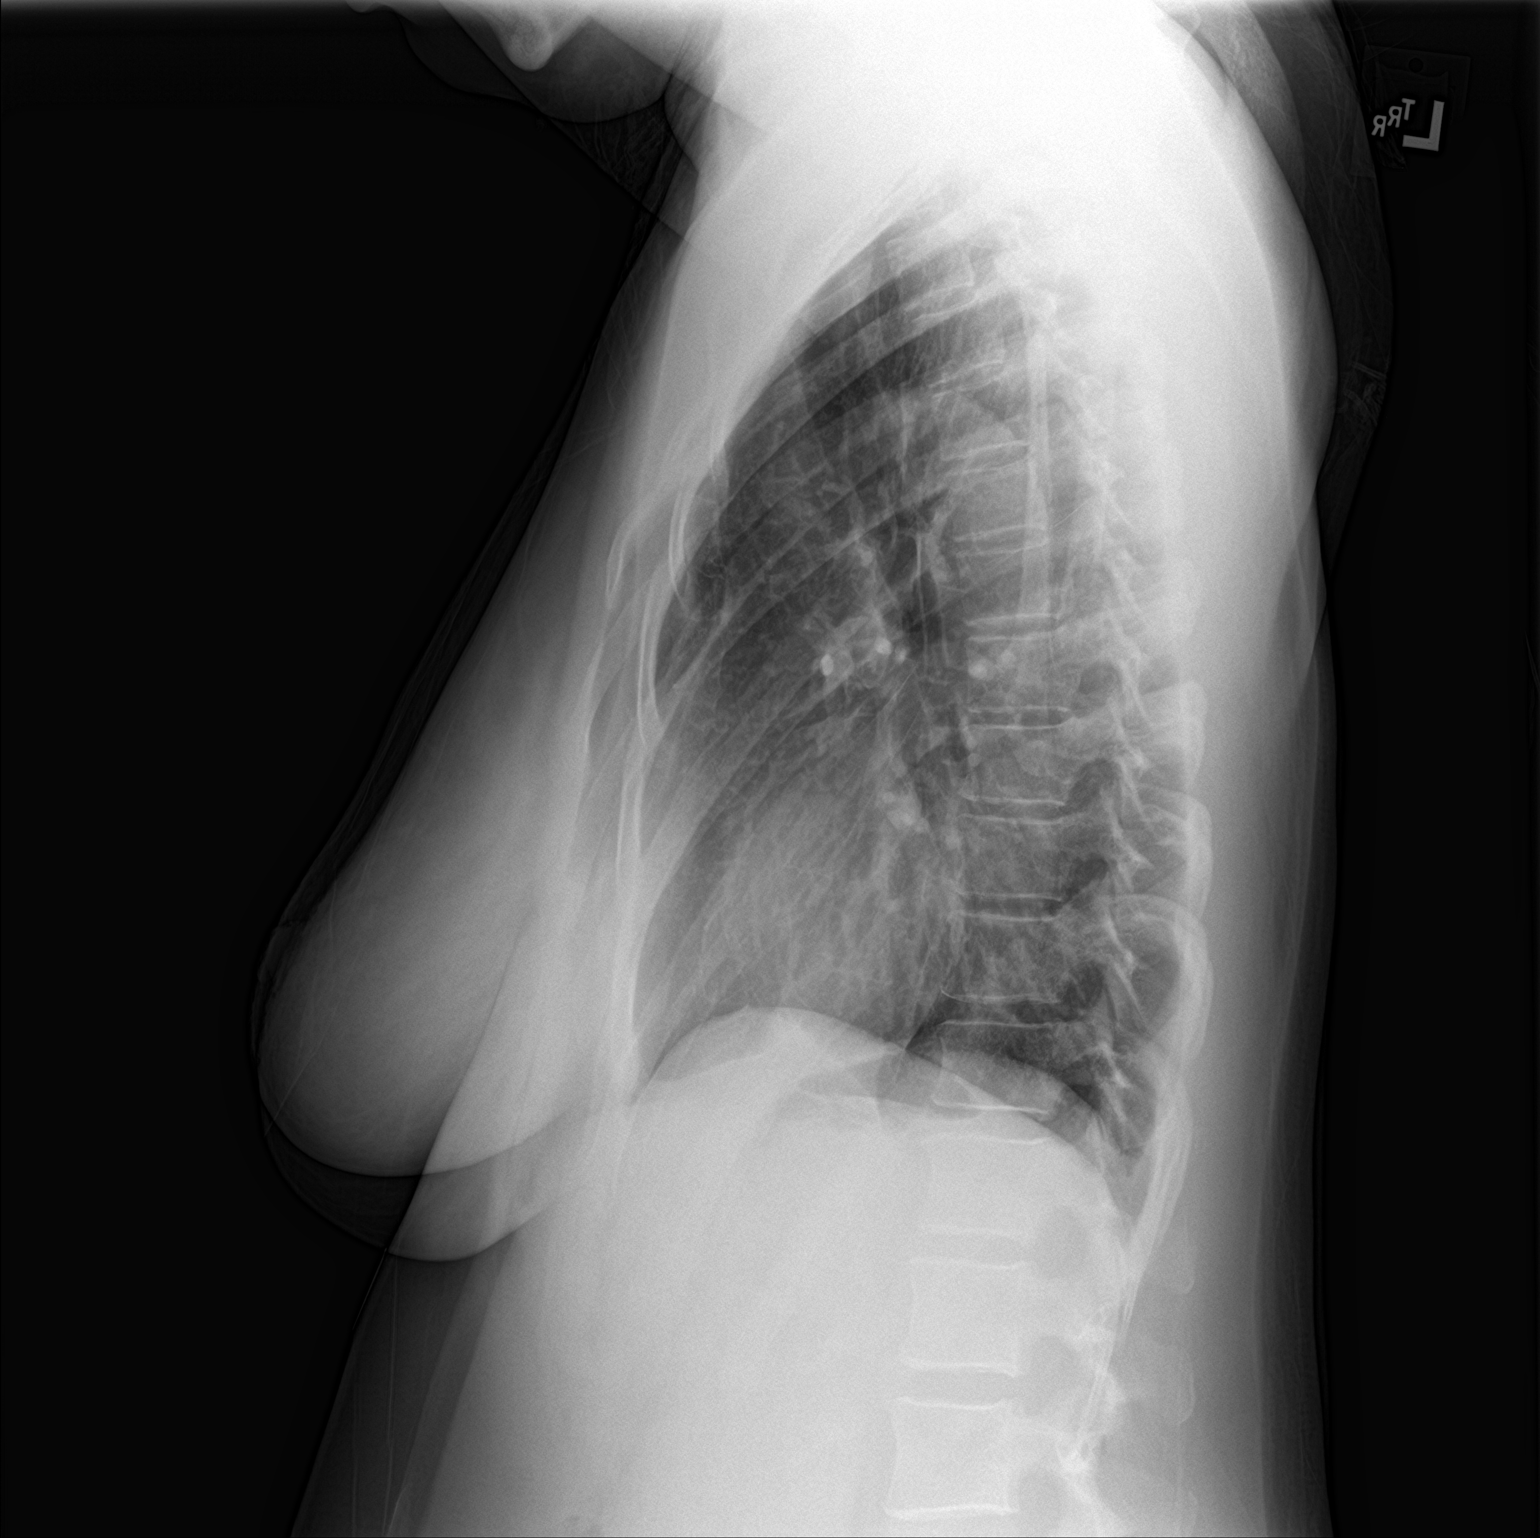

[2 of 2 positions shown; findings below may reference images not displayed]

FINDINGS: Heart and mediastinal contours are within normal limits. No focal
opacities or effusions. No acute bony abnormality.
IMPRESSION: No active cardiopulmonary disease.

## 2020-06-03 ENCOUNTER — Ambulatory Visit: Payer: Self-pay

## 2021-02-01 ENCOUNTER — Emergency Department
Admission: EM | Admit: 2021-02-01 | Discharge: 2021-02-01 | Disposition: A | Discharge status: Home / Self Care | Payer: Medicaid Other | Attending: Emergency Medicine | Admitting: Emergency Medicine

## 2021-02-01 ENCOUNTER — Other Ambulatory Visit: Payer: Self-pay

## 2021-02-01 ENCOUNTER — Encounter: Payer: Self-pay | Admitting: *Deleted

## 2021-02-01 DIAGNOSIS — Z79899 Other long term (current) drug therapy: Secondary | ICD-10-CM | POA: Insufficient documentation

## 2021-02-01 DIAGNOSIS — B029 Zoster without complications: Secondary | ICD-10-CM | POA: Insufficient documentation

## 2021-02-01 DIAGNOSIS — I10 Essential (primary) hypertension: Secondary | ICD-10-CM | POA: Insufficient documentation

## 2021-02-01 DIAGNOSIS — R21 Rash and other nonspecific skin eruption: Secondary | ICD-10-CM | POA: Diagnosis present

## 2021-02-01 MED ORDER — VALACYCLOVIR HCL 1 G PO TABS: 2000.0000 mg | ORAL_TABLET | Freq: Three times a day (TID) | ORAL | 0 refills | Status: DC

## 2021-02-01 MED ORDER — VALACYCLOVIR HCL 1 G PO TABS: 1000.0000 mg | ORAL_TABLET | Freq: Three times a day (TID) | ORAL | 0 refills | Status: AC

## 2021-02-01 NOTE — ED Triage Notes (Signed)
Pt states she started with a rash on the left lower abdomen around 4 days ago, thought it was ringworm, has been using a creme for the same. Rash is worse. No drainage, somewhat itchy.

## 2021-02-01 NOTE — ED Provider Notes (Signed)
[facility]-EMERGENCY DEPARTMENT  ____________________________________________  Time seen: Approximately 11:28 PM  I have reviewed the triage vital signs and the nursing notes.   HISTORY  Chief Complaint Rash   Historian Patient     HPI Tina Cummings is a 40 y.o. female with a history of hypertension, presents to the emergency department with a vesicular, erythematous, clustered rash along abdomen.  Patient denies any prodrome of burning but states that rash is occasionally pruritic.  She has been under increased stress.  She takes 25 mg of hydrochlorothiazide daily for hypertension.  She denies chest pain, chest tightness or abdominal pain.   Past Medical History:  Diagnosis Date   HTN (hypertension)      Immunizations up to date:  Yes.     Past Medical History:  Diagnosis Date   HTN (hypertension)     Patient Active Problem List   Diagnosis Date Noted   Acute cholecystitis 12/19/2018   Calculus of common bile duct with acute cholecystitis     Past Surgical History:  Procedure Laterality Date   ERCP N/A 12/19/2018   Procedure: ENDOSCOPIC RETROGRADE CHOLANGIOPANCREATOGRAPHY (ERCP);  Surgeon: Midge Minium, MD;  Location: [facility] ENDOSCOPY;  Service: Endoscopy;  Laterality: N/A;   TONSILLECTOMY AND ADENOIDECTOMY     UMBILICAL HERNIA REPAIR  11/14/2008   Open primary repair without mesh (Dr. Jacquenette Shone at [facility])    Prior to Admission medications   Medication Sig Start Date End Date Taking? Authorizing Provider  hydrochlorothiazide (HYDRODIURIL) 25 MG tablet Take 25 mg by mouth daily. 02/13/18   [provider]  medroxyPROGESTERone (DEPO-PROVERA) 150 MG/ML injection Inject 150 mg into the muscle every 3 (three) months. 04/10/15   [provider]  valACYclovir (VALTREX) 1000 MG tablet Take 1 tablet (1,000 mg total) by mouth 3 (three) times daily for 7 days. 02/01/21 02/08/21  Orvil Feil, PA-C    Allergies Patient has no known allergies.  Family History   Problem Relation Age of Onset   Diabetes Mellitus II Brother     Social History Social History   Tobacco Use   Smoking status: Never   Smokeless tobacco: Never  Substance Use Topics   Alcohol use: Not Currently   Drug use: Not Currently     Review of Systems  Constitutional: No fever/chills Eyes:  No discharge ENT: No upper respiratory complaints. Respiratory: no cough. No SOB/ use of accessory muscles to breath Gastrointestinal:   No nausea, no vomiting.  No diarrhea.  No constipation. Musculoskeletal: Negative for musculoskeletal pain. Skin: Patient has rash.    ____________________________________________   PHYSICAL EXAM:  VITAL SIGNS: ED Triage Vitals  Enc Vitals Group     BP 02/01/21 1945 (S) (!) 184/113     Pulse Rate 02/01/21 1945 81     Resp 02/01/21 1945 18     Temp 02/01/21 1945 99.9 F (37.7 C)     Temp Source 02/01/21 1945 Oral     SpO2 02/01/21 1945 100 %     Weight --      Height --      Head Circumference --      Peak Flow --      Pain Score 02/01/21 1948 0     Pain Loc --      Pain Edu? --      Excl. in GC? --      Constitutional: Alert and oriented. Well appearing and in no acute distress. Eyes: Conjunctivae are normal. PERRL. EOMI. Head: Atraumatic. ENT: Cardiovascular: Normal rate, regular  rhythm. Normal S1 and S2.  Good peripheral circulation. Respiratory: Normal respiratory effort without tachypnea or retractions. Lungs CTAB. Good air entry to the bases with no decreased or absent breath sounds Gastrointestinal: Bowel sounds x 4 quadrants. Soft and nontender to palpation. No guarding or rigidity. No distention. Musculoskeletal: Full range of motion to all extremities. No obvious deformities noted Neurologic:  Normal for age. No gross focal neurologic deficits are appreciated.  Skin: Patient has clustered, vesicular, erythematous rash in a dermatomal distribution across abdomen. Psychiatric: Mood and affect are normal for age.  Speech and behavior are normal.   ____________________________________________   LABS (all labs ordered are listed, but only abnormal results are displayed)  Labs Reviewed - No data to display ____________________________________________  EKG   ____________________________________________  RADIOLOGY  No results found.  ____________________________________________    PROCEDURES  Procedure(s) performed:     Procedures     Medications - No data to display   ____________________________________________   INITIAL IMPRESSION / ASSESSMENT AND PLAN / ED COURSE  Pertinent labs & imaging results that were available during my care of the patient were reviewed by me and considered in my medical decision making (see chart for details).      Assessment and plan Shingles 40 year old female presents to the emergency department with a clustered, vesicular rash that is erythematous along abdomen.  History and physical exam findings suggest shingles.  Patient was discharged with Valtrex 3 times daily for the next 7 days.  I did caution patient that I was concerned about her elevated blood pressure in the emergency department.  She stated that she had taken her hydrochlorothiazide this morning.  I recommended following up with her PCP to readdress hypertension if elevated blood pressure persists.  All patient questions were answered.     ____________________________________________  FINAL CLINICAL IMPRESSION(S) / ED DIAGNOSES  Final diagnoses:  Herpes zoster without complication      NEW MEDICATIONS STARTED DURING THIS VISIT:  ED Discharge Orders          Ordered    valACYclovir (VALTREX) 1000 MG tablet  3 times daily,   Status:  Discontinued        02/01/21 2114    valACYclovir (VALTREX) 1000 MG tablet  3 times daily        02/01/21 2115                This chart was dictated using voice recognition software/Dragon. Despite best efforts to proofread,  errors can occur which can change the meaning. Any change was purely unintentional.     Gasper Lloyd 02/01/21 2330    Chesley Noon, MD 02/03/21 [phone]

## 2021-02-01 NOTE — Discharge Instructions (Addendum)
Take Valtrex 3 times daily for the next 7 days.

## 2021-06-08 ENCOUNTER — Ambulatory Visit (LOCAL_COMMUNITY_HEALTH_CENTER): Payer: Medicaid Other

## 2021-06-08 ENCOUNTER — Other Ambulatory Visit: Payer: Self-pay

## 2021-06-08 DIAGNOSIS — Z111 Encounter for screening for respiratory tuberculosis: Secondary | ICD-10-CM

## 2021-06-11 ENCOUNTER — Other Ambulatory Visit: Payer: Self-pay

## 2021-06-11 ENCOUNTER — Ambulatory Visit (LOCAL_COMMUNITY_HEALTH_CENTER): Payer: Medicaid Other

## 2021-06-11 DIAGNOSIS — Z111 Encounter for screening for respiratory tuberculosis: Secondary | ICD-10-CM

## 2021-06-11 LAB — TB SKIN TEST
Induration: 0 mm
TB Skin Test: NEGATIVE

## 2021-08-17 ENCOUNTER — Ambulatory Visit: Payer: Medicaid Other

## 2023-06-01 ENCOUNTER — Ambulatory Visit
Admission: EM | Admit: 2023-06-01 | Discharge: 2023-06-01 | Disposition: A | Discharge status: Home / Self Care | Payer: Medicaid Other | Attending: Emergency Medicine | Admitting: Emergency Medicine

## 2023-06-01 DIAGNOSIS — N898 Other specified noninflammatory disorders of vagina: Secondary | ICD-10-CM | POA: Diagnosis present

## 2023-06-01 LAB — POCT URINE PREGNANCY: Preg Test, Ur: NEGATIVE

## 2023-06-01 MED ORDER — FLUCONAZOLE 150 MG PO TABS: 150.0000 mg | ORAL_TABLET | Freq: Every day | ORAL | 0 refills | Status: AC

## 2023-06-01 NOTE — ED Triage Notes (Signed)
Patient to Urgent Care with complaints of vaginal irritation. Reports she had felt like she had some swelling but this had improved. Reports she has had a lot of dryness. Denies any discharge. Denies any urinary symptoms.  Symptoms started approx 1 week ago. Denies any STD concerns.

## 2023-06-01 NOTE — ED Provider Notes (Signed)
Tina Cummings    CSN: [id number] Arrival date & time: 06/01/23  1601      History   Chief Complaint Chief Complaint  Patient presents with   Vaginal Itching    HPI Tina Cummings is a 42 y.o. female.   Patient presents for evaluation of vaginal itching and dryness present for 7 days.  Had 1 occurrence of lower back pain 1 day ago which has resolved.  Has not attempted treatment of symptoms.  Sexually active, no concern for STD.  Receiving Depo-Provera, has not had menstruation within years.  No concern for pregnancy.  Denies urinary symptoms, vaginal discharge, vaginal odor, abdominal pain or pressure, new rash or lesions, fever.  Past Medical History:  Diagnosis Date   HTN (hypertension)     Patient Active Problem List   Diagnosis Date Noted   Acute cholecystitis 12/19/2018   Calculus of common bile duct with acute cholecystitis     Past Surgical History:  Procedure Laterality Date   ERCP N/A 12/19/2018   Procedure: ENDOSCOPIC RETROGRADE CHOLANGIOPANCREATOGRAPHY (ERCP);  Surgeon: Midge Minium, MD;  Location: [facility] ENDOSCOPY;  Service: Endoscopy;  Laterality: N/A;   TONSILLECTOMY AND ADENOIDECTOMY     UMBILICAL HERNIA REPAIR  11/14/2008   Open primary repair without mesh (Dr. Jacquenette Shone at [facility])    OB History   No obstetric history on file.      Home Medications    Prior to Admission medications   Medication Sig Start Date End Date Taking? Authorizing Provider  fluconazole (DIFLUCAN) 150 MG tablet Take 1 tablet (150 mg total) by mouth daily for 2 doses. 06/01/23 06/03/23 Yes Ebb Carelock R, NP  hydrochlorothiazide (HYDRODIURIL) 25 MG tablet Take 25 mg by mouth daily. 02/13/18   [provider]  medroxyPROGESTERone (DEPO-PROVERA) 150 MG/ML injection Inject 150 mg into the muscle every 3 (three) months. 04/10/15   [provider]    Family History Family History  Problem Relation Age of Onset   Diabetes Mellitus II Brother     Social  History Social History   Tobacco Use   Smoking status: Never   Smokeless tobacco: Never  Substance Use Topics   Alcohol use: Not Currently   Drug use: Not Currently     Allergies   Patient has no known allergies.   Review of Systems Review of Systems   Physical Exam Triage Vital Signs ED Triage Vitals  Encounter Vitals Group     BP 06/01/23 1619 (!) 141/88     Systolic BP Percentile --      Diastolic BP Percentile --      Pulse Rate 06/01/23 1619 86     Resp 06/01/23 1619 18     Temp 06/01/23 1619 98 F (36.7 C)     Temp src --      SpO2 06/01/23 1619 98 %     Weight --      Height --      Head Circumference --      Peak Flow --      Pain Score 06/01/23 1618 0     Pain Loc --      Pain Education --      Exclude from Growth Chart --    No data found.  Updated Vital Signs BP (!) 141/88   Pulse 86   Temp 98 F (36.7 C)   Resp 18   SpO2 98%   Visual Acuity Right Eye Distance:   Left Eye Distance:   Bilateral Distance:  Right Eye Near:   Left Eye Near:    Bilateral Near:     Physical Exam Constitutional:      Appearance: Normal appearance.  Eyes:     Extraocular Movements: Extraocular movements intact.  Pulmonary:     Effort: Pulmonary effort is normal.  Genitourinary:    Comments: deferred Neurological:     Mental Status: She is alert and oriented to person, place, and time. Mental status is at baseline.      UC Treatments / Results  Labs (all labs ordered are listed, but only abnormal results are displayed) Labs Reviewed  RPR  HIV ANTIBODY (ROUTINE TESTING W REFLEX)  POCT URINE PREGNANCY  CERVICOVAGINAL ANCILLARY ONLY    EKG   Radiology No results found.  Procedures Procedures (including critical care time)  Medications Ordered in UC Medications - No data to display  Initial Impression / Assessment and Plan / UC Course  I have reviewed the triage vital signs and the nursing notes.  Pertinent labs & imaging results  that were available during my care of the patient were reviewed by me and considered in my medical decision making (see chart for details).  Vaginal itching  Prophylactically treating for yeast based on symptomology, UA urine pregnancy negative, Diflucan prescribed, discussed administration, advised abstinence during treatment until symptoms resolved and all labs have resulted, advised condom use during all encounters moving forward, will treat per protocol Final Clinical Impressions(s) / UC Diagnoses   Final diagnoses:  Vaginal itching     Discharge Instructions      Today you are being treated prophylactically for yeast.   Take diflucan 150 mg once, if symptoms still present in 3 days then you may take second pill   Yeast infections which are caused by a naturally occurring fungus called candida. Vaginosis is an inflammation of the vagina that can result in discharge, itching and pain. The cause is usually a change in the normal balance of vaginal bacteria or an infection. Vaginosis can also result from reduced estrogen levels after menopause.  Labs pending 2-3 days, you will be contacted if positive for any sti and treatment will be sent to the pharmacy, you will have to return to the clinic if positive for gonorrhea to receive treatment   Please refrain from having sex until labs results, if positive please refrain from having sex until treatment complete and symptoms resolve   If positive for HIV, Syphilis, Chlamydia  gonorrhea or trichomoniasis please notify partner or partners so they may tested as well  Moving forward, it is recommended you use some form of protection against the transmission of sti infections  such as condoms or dental dams with each sexual encounter    In addition:   Avoid baths, hot tubs and whirlpool spas.  Don't use scented or harsh soaps, such as those with deodorant or antibacterial action. Avoid irritants. These include scented tampons and pads. Wipe  from front to back after using the toilet.  Don't douche. Your vagina doesn't require cleansing other than normal bathing.  Use a  condom. Wear cotton underwear, this fabric helps absorb moisture     ED Prescriptions     Medication Sig Dispense Auth. Provider   fluconazole (DIFLUCAN) 150 MG tablet Take 1 tablet (150 mg total) by mouth daily for 2 doses. 2 tablet Valinda Hoar, NP      PDMP not reviewed this encounter.   Valinda Hoar, NP 06/01/23 1655

## 2023-06-01 NOTE — Discharge Instructions (Addendum)
Today you are being treated prophylactically for yeast.   Take diflucan 150 mg once, if symptoms still present in 3 days then you may take second pill   Yeast infections which are caused by a naturally occurring fungus called candida. Vaginosis is an inflammation of the vagina that can result in discharge, itching and pain. The cause is usually a change in the normal balance of vaginal bacteria or an infection. Vaginosis can also result from reduced estrogen levels after menopause.  Labs pending 2-3 days, you will be contacted if positive for any sti and treatment will be sent to the pharmacy, you will have to return to the clinic if positive for gonorrhea to receive treatment   Please refrain from having sex until labs results, if positive please refrain from having sex until treatment complete and symptoms resolve   If positive for HIV, Syphilis, Chlamydia  gonorrhea or trichomoniasis please notify partner or partners so they may tested as well  Moving forward, it is recommended you use some form of protection against the transmission of sti infections  such as condoms or dental dams with each sexual encounter    In addition:   Avoid baths, hot tubs and whirlpool spas.  Don't use scented or harsh soaps, such as those with deodorant or antibacterial action. Avoid irritants. These include scented tampons and pads. Wipe from front to back after using the toilet.  Don't douche. Your vagina doesn't require cleansing other than normal bathing.  Use a  condom. Wear cotton underwear, this fabric helps absorb moisture

## 2023-06-02 LAB — CERVICOVAGINAL ANCILLARY ONLY
Bacterial Vaginitis (gardnerella): NEGATIVE
Candida Glabrata: NEGATIVE
Candida Vaginitis: POSITIVE — AB
Chlamydia: NEGATIVE
Comment: NEGATIVE
Comment: NEGATIVE
Comment: NEGATIVE
Comment: NEGATIVE
Comment: NEGATIVE
Comment: NORMAL
Neisseria Gonorrhea: NEGATIVE
Trichomonas: NEGATIVE

## 2023-06-02 LAB — HIV ANTIBODY (ROUTINE TESTING W REFLEX): HIV Screen 4th Generation wRfx: NONREACTIVE

## 2023-06-02 LAB — RPR: RPR Ser Ql: NONREACTIVE

## 2023-06-05 NOTE — Plan of Care (Signed)
[facility] Tonsillectomy/Adenoidectomy, Postoperative PEDS care plan entered in error.
